# Patient Record
Sex: Female | Born: 1995 | Race: White | Hispanic: No | Marital: Single | Smoking: Never smoker
Health system: Southern US, Community
[De-identification: ages and names within clinical notes are randomized; demographics above are authoritative.]

## PROBLEM LIST (undated history)

## (undated) DIAGNOSIS — F419 Anxiety disorder, unspecified: Secondary | ICD-10-CM

## (undated) HISTORY — PX: LAPAROSCOPY: SHX197

## (undated) HISTORY — PX: KNEE ARTHROSCOPY: SHX127

## (undated) HISTORY — DX: Anxiety disorder, unspecified: F41.9

---
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
## 2017-05-31 DIAGNOSIS — H04122 Dry eye syndrome of left lacrimal gland: Secondary | ICD-10-CM | POA: Diagnosis not present

## 2017-05-31 DIAGNOSIS — H11151 Pinguecula, right eye: Secondary | ICD-10-CM | POA: Diagnosis not present

## 2017-11-05 ENCOUNTER — Other Ambulatory Visit: Payer: Self-pay | Admitting: Nurse Practitioner

## 2017-11-05 ENCOUNTER — Other Ambulatory Visit (HOSPITAL_COMMUNITY)
Admission: RE | Admit: 2017-11-05 | Discharge: 2017-11-05 | Disposition: A | Discharge status: Home / Self Care | Payer: Commercial Managed Care - PPO | Source: Ambulatory Visit | Attending: Nurse Practitioner | Admitting: Nurse Practitioner

## 2017-11-05 DIAGNOSIS — N6320 Unspecified lump in the left breast, unspecified quadrant: Secondary | ICD-10-CM | POA: Diagnosis not present

## 2017-11-05 DIAGNOSIS — Z01419 Encounter for gynecological examination (general) (routine) without abnormal findings: Secondary | ICD-10-CM | POA: Insufficient documentation

## 2017-11-05 DIAGNOSIS — R21 Rash and other nonspecific skin eruption: Secondary | ICD-10-CM | POA: Diagnosis not present

## 2017-11-08 LAB — CYTOLOGY - PAP
CHLAMYDIA, DNA PROBE: NEGATIVE
DIAGNOSIS: NEGATIVE
NEISSERIA GONORRHEA: NEGATIVE

## 2017-11-11 DIAGNOSIS — J0100 Acute maxillary sinusitis, unspecified: Secondary | ICD-10-CM | POA: Diagnosis not present

## 2017-11-14 ENCOUNTER — Other Ambulatory Visit: Payer: Self-pay

## 2017-11-26 ENCOUNTER — Ambulatory Visit
Admission: RE | Admit: 2017-11-26 | Discharge: 2017-11-26 | Disposition: A | Discharge status: Home / Self Care | Payer: Commercial Managed Care - PPO | Source: Ambulatory Visit | Attending: Nurse Practitioner | Admitting: Nurse Practitioner

## 2017-11-26 ENCOUNTER — Other Ambulatory Visit: Payer: Self-pay

## 2017-11-26 ENCOUNTER — Other Ambulatory Visit: Payer: Self-pay | Admitting: Nurse Practitioner

## 2017-11-26 DIAGNOSIS — N6320 Unspecified lump in the left breast, unspecified quadrant: Secondary | ICD-10-CM

## 2017-11-26 DIAGNOSIS — N6323 Unspecified lump in the left breast, lower outer quadrant: Secondary | ICD-10-CM | POA: Diagnosis not present

## 2017-12-04 DIAGNOSIS — J209 Acute bronchitis, unspecified: Secondary | ICD-10-CM | POA: Diagnosis not present

## 2018-05-27 ENCOUNTER — Ambulatory Visit
Admission: RE | Admit: 2018-05-27 | Discharge: 2018-05-27 | Disposition: A | Discharge status: Home / Self Care | Payer: 59 | Source: Ambulatory Visit | Attending: Nurse Practitioner | Admitting: Nurse Practitioner

## 2018-05-27 ENCOUNTER — Other Ambulatory Visit: Payer: Self-pay | Admitting: Nurse Practitioner

## 2018-05-27 DIAGNOSIS — N6320 Unspecified lump in the left breast, unspecified quadrant: Secondary | ICD-10-CM

## 2018-11-11 ENCOUNTER — Ambulatory Visit: Payer: Self-pay | Admitting: Nurse Practitioner

## 2018-11-11 VITALS — BP 116/70 | HR 82 | Temp 99.1°F | Resp 20 | Wt 110.0 lb

## 2018-11-11 DIAGNOSIS — R6889 Other general symptoms and signs: Secondary | ICD-10-CM

## 2018-11-11 DIAGNOSIS — J029 Acute pharyngitis, unspecified: Secondary | ICD-10-CM

## 2018-11-11 LAB — POCT RAPID STREP A (OFFICE): Rapid Strep A Screen: NEGATIVE

## 2018-11-11 LAB — POCT INFLUENZA A/B
Influenza A, POC: NEGATIVE
Influenza B, POC: NEGATIVE

## 2018-11-11 NOTE — Patient Instructions (Signed)
Influenza-Like Symptoms, Adult  -Ibuprofen or Tylenol for pain, fever, or general discomfort. -Increase fluids. -Sleep elevated on at least 2 pillows at bedtime to help with cough. -Use a humidifier or vaporizer when at home and during sleep to help with cough. -Purchase OTC Delsym cough medicine.  Use as directed. -May use a teaspoon of honey or over-the-counter cough drops to help with cough. -Get plenty of rest. -Follow-up if symptoms do not improve.  Influenza, more commonly known as "the flu," is a viral infection that mainly affects the respiratory tract. The respiratory tract includes organs that help you breathe, such as the lungs, nose, and throat. The flu causes many symptoms similar to the common cold along with high fever and body aches. The flu spreads easily from person to person (is contagious). Getting a flu shot (influenza vaccination) every year is the best way to prevent the flu. What are the causes? This condition is caused by the influenza virus. You can get the virus by:  Breathing in droplets that are in the air from an infected person's cough or sneeze.  Touching something that has been exposed to the virus (has been contaminated) and then touching your mouth, nose, or eyes. What increases the risk? The following factors may make you more likely to get the flu:  Not washing or sanitizing your hands often.  Having close contact with many people during cold and flu season.  Touching your mouth, eyes, or nose without first washing or sanitizing your hands.  Not getting a yearly (annual) flu shot. You may have a higher risk for the flu, including serious problems such as a lung infection (pneumonia), if you:  Are older than 65.  Are pregnant.  Have a weakened disease-fighting system (immune system). You may have a weakened immune system if you: ? Have HIV or AIDS. ? Are undergoing chemotherapy. ? Are taking medicines that reduce (suppress) the activity of  your immune system.  Have a long-term (chronic) illness, such as heart disease, kidney disease, diabetes, or lung disease.  Have a liver disorder.  Are severely overweight (morbidly obese).  Have anemia. This is a condition that affects your red blood cells.  Have asthma. What are the signs or symptoms? Symptoms of this condition usually begin suddenly and last 4-14 days. They may include:  Fever and chills.  Headaches, body aches, or muscle aches.  Sore throat.  Cough.  Runny or stuffy (congested) nose.  Chest discomfort.  Poor appetite.  Weakness or fatigue.  Dizziness.  Nausea or vomiting. How is this diagnosed? This condition may be diagnosed based on:  Your symptoms and medical history.  A physical exam.  Swabbing your nose or throat and testing the fluid for the influenza virus. How is this treated? If the flu is diagnosed early, you can be treated with medicine that can help reduce how severe the illness is and how long it lasts (antiviral medicine). This may be given by mouth (orally) or through an IV. Taking care of yourself at home can help relieve symptoms. Your health care provider may recommend:  Taking over-the-counter medicines.  Drinking plenty of fluids. In many cases, the flu goes away on its own. If you have severe symptoms or complications, you may be treated in a hospital. Follow these instructions at home: Activity  Rest as needed and get plenty of sleep.  Stay home from work or school as told by your health care provider. Unless you are visiting your health care provider,  avoid leaving home until your fever has been gone for 24 hours without taking medicine. Eating and drinking  Take an oral rehydration solution (ORS). This is a drink that is sold at pharmacies and retail stores.  Drink enough fluid to keep your urine pale yellow.  Drink clear fluids in small amounts as you are able. Clear fluids include water, ice chips, diluted  fruit juice, and low-calorie sports drinks.  Eat bland, easy-to-digest foods in small amounts as you are able. These foods include bananas, applesauce, rice, lean meats, toast, and crackers.  Avoid drinking fluids that contain a lot of sugar or caffeine, such as energy drinks, regular sports drinks, and soda.  Avoid alcohol.  Avoid spicy or fatty foods. General instructions      Take over-the-counter and prescription medicines only as told by your health care provider.  Use a cool mist humidifier to add humidity to the air in your home. This can make it easier to breathe.  Cover your mouth and nose when you cough or sneeze.  Wash your hands with soap and water often, especially after you cough or sneeze. If soap and water are not available, use alcohol-based hand sanitizer.  Keep all follow-up visits as told by your health care provider. This is important. How is this prevented?   Get an annual flu shot. You may get the flu shot in late summer, fall, or winter. Ask your health care provider when you should get your flu shot.  Avoid contact with people who are sick during cold and flu season. This is generally fall and winter. Contact a health care provider if:  You develop new symptoms.  You have: ? Chest pain. ? Diarrhea. ? A fever.  Your cough gets worse.  You produce more mucus.  You feel nauseous or you vomit. Get help right away if:  You develop shortness of breath or difficulty breathing.  Your skin or nails turn a bluish color.  You have severe pain or stiffness in your neck.  You develop a sudden headache or sudden pain in your face or ear.  You cannot eat or drink without vomiting. Summary  Influenza, more commonly known as "the flu," is a viral infection that primarily affects your respiratory tract.  Symptoms of the flu usually begin suddenly and last 4-14 days.  Getting an annual flu shot is the best way to prevent getting the flu.  Stay home  from work or school as told by your health care provider. Unless you are visiting your health care provider, avoid leaving home until your fever has been gone for 24 hours without taking medicine.  Keep all follow-up visits as told by your health care provider. This is important. This information is not intended to replace advice given to you by your health care provider. Make sure you discuss any questions you have with your health care provider. Document Released: 11/02/2000 Document Revised: 04/23/2018 Document Reviewed: 04/23/2018 Elsevier Interactive Patient Education  2019 [employer]

## 2018-11-11 NOTE — Progress Notes (Signed)
Subjective:  Jasmine Greene is a 22 y.o. female who presents for evaluation of influenza like symptoms.  Symptoms include cough described as nonproductive, headache described as dull and sore throat.  Onset of symptoms was 1 day ago, and has been unchanged since that time.  Treatment to date:  acetaminophen.  High risk factors for influenza complications:  none.  Patient states several of her family members have been diagnosed with influenza and she wanted to make sure she did not have it.  The following portions of the patient's history were reviewed and updated as appropriate:  allergies, current medications and past medical history.  Constitutional: positive for fatigue, negative for anorexia, chills, fevers and malaise Eyes: negative Ears, nose, mouth, throat, and face: negative Respiratory: positive for cough, negative for asthma, chronic bronchitis, pneumonia, stridor and wheezing Cardiovascular: negative Gastrointestinal: negative Neurological: positive for headaches, negative for coordination problems, dizziness, paresthesia, vertigo and weakness Objective:  BP 116/70 (BP Location: Right Arm, Patient Position: Sitting, Cuff Size: Normal)   Pulse 82   Temp 99.1 F (37.3 C) (Oral)   Resp 20   Wt 110 lb (49.9 kg)   SpO2 98%  General appearance: alert, cooperative, fatigued and no distress Head: Normocephalic, without obvious abnormality, atraumatic Eyes: conjunctivae/corneas clear. PERRL, EOM's intact. Fundi benign. Ears: normal TM's and external ear canals both ears Nose: no discharge, no congestion, sinus tenderness bilateral Throat: abnormal findings: mild oropharyngeal erythema Lungs: clear to auscultation bilaterally Heart: regular rate and rhythm, S1, S2 normal, no murmur, click, rub or gallop Abdomen: soft, non-tender; bowel sounds normal; no masses,  no organomegaly Pulses: 2+ and symmetric Skin: Skin color, texture, turgor normal. No rashes or lesions Lymph nodes:  Cervical adenopathy: to right neck, patient informs she is having US performed in Jan. Neurologic: Grossly normal    Assessment:  influenza like syndrome    Plan:   Exam findings, diagnosis etiology and medication use and indications reviewed with patient. Follow- Up and discharge instructions provided. No emergent/urgent issues found on exam.  Patient appears in no acute distress, vital signs are stable, and is well-appearing.  Gust with patient about Tamiflu.  Patient declined Tamiflu at this time.  Patient felt she would be able to perform symptomatic treatment that would cover her symptoms.  Patient education was provided. Patient verbalized understanding of information provided and agrees with plan of care (POC), all questions answered. The patient is advised to call or return to clinic if condition does not see an improvement in symptoms, or to seek the care of the closest emergency department if condition worsens with the above plan.   1. Sore throat  - POCT Influenza A/B-negative - POCT rapid strep A-negative  2. Influenza-like symptoms  -Ibuprofen or Tylenol for pain, fever, or general discomfort. -Increase fluids. -Sleep elevated on at least 2 pillows at bedtime to help with cough. -Use a humidifier or vaporizer when at home and during sleep to help with cough. -Purchase OTC Delsym cough medicine.  Use as directed. -May use a teaspoon of honey or over-the-counter cough drops to help with cough. -Get plenty of rest. -Follow-up if symptoms do not improve.

## 2018-11-13 ENCOUNTER — Other Ambulatory Visit: Payer: Self-pay | Admitting: Nurse Practitioner

## 2018-11-13 DIAGNOSIS — R591 Generalized enlarged lymph nodes: Secondary | ICD-10-CM

## 2018-11-17 ENCOUNTER — Ambulatory Visit
Admission: RE | Admit: 2018-11-17 | Discharge: 2018-11-17 | Disposition: A | Discharge status: Home / Self Care | Payer: 59 | Source: Ambulatory Visit | Attending: Nurse Practitioner | Admitting: Nurse Practitioner

## 2018-11-17 ENCOUNTER — Other Ambulatory Visit: Payer: 59

## 2018-11-17 DIAGNOSIS — R591 Generalized enlarged lymph nodes: Secondary | ICD-10-CM

## 2018-11-28 ENCOUNTER — Other Ambulatory Visit: Payer: Self-pay

## 2018-11-28 ENCOUNTER — Ambulatory Visit
Admission: RE | Admit: 2018-11-28 | Discharge: 2018-11-28 | Disposition: A | Discharge status: Home / Self Care | Payer: 59 | Source: Ambulatory Visit | Attending: Nurse Practitioner | Admitting: Nurse Practitioner

## 2018-11-28 DIAGNOSIS — N6320 Unspecified lump in the left breast, unspecified quadrant: Secondary | ICD-10-CM

## 2019-06-18 ENCOUNTER — Other Ambulatory Visit: Payer: Self-pay | Admitting: Gastroenterology

## 2019-06-18 DIAGNOSIS — R109 Unspecified abdominal pain: Secondary | ICD-10-CM

## 2019-06-23 ENCOUNTER — Other Ambulatory Visit: Payer: Self-pay | Admitting: Gastroenterology

## 2019-06-23 DIAGNOSIS — R109 Unspecified abdominal pain: Secondary | ICD-10-CM

## 2019-06-24 ENCOUNTER — Ambulatory Visit: Payer: 59 | Admitting: Cardiology

## 2019-06-30 ENCOUNTER — Other Ambulatory Visit: Payer: 59

## 2019-07-02 ENCOUNTER — Other Ambulatory Visit: Payer: Self-pay

## 2019-07-02 ENCOUNTER — Encounter: Payer: Self-pay | Admitting: Adult Health

## 2019-07-02 ENCOUNTER — Ambulatory Visit (INDEPENDENT_AMBULATORY_CARE_PROVIDER_SITE_OTHER): Payer: 59 | Admitting: Adult Health

## 2019-07-02 VITALS — BP 121/80 | HR 89 | Ht 61.0 in | Wt 99.0 lb

## 2019-07-02 DIAGNOSIS — F411 Generalized anxiety disorder: Secondary | ICD-10-CM

## 2019-07-02 DIAGNOSIS — F410 Panic disorder [episodic paroxysmal anxiety] without agoraphobia: Secondary | ICD-10-CM | POA: Diagnosis not present

## 2019-07-02 MED ORDER — LORAZEPAM 0.5 MG PO TABS: ORAL_TABLET | ORAL | 0 refills | Status: DC

## 2019-07-02 MED ORDER — CITALOPRAM HYDROBROMIDE 10 MG PO TABS: 10.0000 mg | ORAL_TABLET | Freq: Every day | ORAL | 2 refills | Status: DC

## 2019-07-02 NOTE — Progress Notes (Signed)
[facility] MD/PA/NP Initial Note  07/02/2019 4:27 PM Jasmine Greene  MRN:  [id number]  Chief Complaint:  Chief Complaint    Anxiety      HPI: Patient seen today for anxiety disorder.   Describes mood today as "so-so". Mood symptoms - denies depression. Increased irritability. More anxious overall. Having panic attacks - recently started over past 3 to 4 weeks. Had to pull over on side of road while driving and called father to come and get her - felt like she couldn't breathe and called 911. Gets stressed and overwhelmed about going to doctor's offices. More anxious in the evenings between 5 to 8 pm. Staying in bed on days she wakes up with "a lot of anxiety". Anxiety runs in family. More stress at home right now - increased more so recently. Symptoms increase when losing control or not knowing how things are going to go. History of ADHD high school. Did not tolerate stimulants. Continues to have issues with focus and concentration. Energy levels low. Hasn't been eating at all - feels nauseas. Feels more energetic some days - "not in a while". Decreased interest and motivation.  Enjoys some usual interests and activities. Spending time with family and dog. Dog lives with her at school - "bear" white lab.  Appetite adequate. Weight loss - was 120 and then she started having GI issues. Has changed diet.  Sleeping better some night than others. Tries to be in bed by 10 and up by 7.  Focus and concentration difficulties. Completing tasks. Manages some aspects of household. Currently lives off campus in a one bedroom apartment at [employer].  Denies SI or HI. Denies AH or VH.  Mother suggested Celexa since she is able to tolerate it.  Visit Diagnosis:    ICD-10-CM   1. Generalized anxiety disorder  F41.1 citalopram (CELEXA) 10 MG tablet    LORazepam (ATIVAN) 0.5 MG tablet    DISCONTINUED: LORazepam (ATIVAN) 0.5 MG tablet  2. Panic attacks  F41.0 citalopram (CELEXA) 10 MG tablet    LORazepam (ATIVAN) 0.5 MG tablet    DISCONTINUED: LORazepam (ATIVAN) 0.5 MG tablet    Past Psychiatric History: History of ADHD - Adderall, other medications - "dizzy and light headed".  Previous medications: Zoloft, Lexapro  Past Medical History:  Past Medical History:  Diagnosis Date  . Anxiety    History reviewed. No pertinent surgical history.  Family Psychiatric History: Has 3 brothers - 1 with anxiety and depression, one with anxiety. Mother also with anxiety.  Family History:  Family History  Problem Relation Age of Onset  . Breast cancer Maternal Grandmother     Social History:  Social History   Socioeconomic History  . Marital status: Single    Spouse name: Not on file  . Number of children: Not on file  . Years of education: Not on file  . Highest education level: Not on file  Occupational History  . Not on file  Social Needs  . Financial resource strain: Not on file  . Food insecurity    Worry: Not on file    Inability: Not on file  . Transportation needs    Medical: Not on file    Non-medical: Not on file  Tobacco Use  . Smoking status: Never Smoker  . Smokeless tobacco: Never Used  Substance and Sexual Activity  . Alcohol use: Yes    Comment: occasionally  . Drug use: Not Currently    Types: Marijuana    Comment: freshman year  of high school  . Sexual activity: Never  Lifestyle  . Physical activity    Days per week: Not on file    Minutes per session: Not on file  . Stress: Not on file  Relationships  . Social Herbalist on phone: Not on file    Gets together: Not on file    Attends religious service: Not on file    Active member of club or organization: Not on file    Attends meetings of clubs or organizations: Not on file    Relationship status: Not on file  Other Topics Concern  . Not on file  Social History Narrative  . Not on file    Allergies:  Allergies  Allergen Reactions  . Penicillins     Metabolic Disorder  Labs: No results found for: HGBA1C, MPG No results found for: PROLACTIN No results found for: CHOL, TRIG, HDL, CHOLHDL, VLDL, LDLCALC No results found for: TSH  Therapeutic Level Labs: No results found for: LITHIUM No results found for: VALPROATE No components found for:  CBMZ  Current Medications: Current Outpatient Medications  Medication Sig Dispense Refill  . citalopram (CELEXA) 10 MG tablet Take 1 tablet (10 mg total) by mouth daily. 30 tablet 2  . drospirenone-ethinyl estradiol (YAZ,GIANVI,LORYNA) 3-0.02 MG tablet Take 1 tablet by mouth daily.    Marland Kitchen LORazepam (ATIVAN) 0.5 MG tablet Take one tablet daily prn for anxiety/panic. 30 tablet 0  . METRONIDAZOLE IN NACL IV Inject into the vein.     No current facility-administered medications for this visit.     Medication Side Effects: none  Orders placed this visit:  No orders of the defined types were placed in this encounter.   Psychiatric Specialty Exam:  ROS  Blood pressure 121/80, pulse 89, height 5\' 1"  (1.549 m), weight 99 lb (44.9 kg).Body mass index is 18.71 kg/m.  General Appearance: Neat and Well Groomed  Eye Contact:  Good  Speech:  Normal Rate  Volume:  Normal  Mood:  Anxious  Affect:  Appropriate and Flat  Thought Process:  Coherent  Orientation:  Full (Time, Place, and Person)  Thought Content: Logical   Suicidal Thoughts:  No  Homicidal Thoughts:  No  Memory:  WNL  Judgement:  Good  Insight:  Good  Psychomotor Activity:  NA and Normal  Concentration:  Concentration: Good  Recall:  Good  Fund of Knowledge: Good  Language: Good  Assets:  Communication Skills Desire for Improvement Financial Resources/Insurance Housing Intimacy Leisure Time Physical Health Resilience Social Support Talents/Skills Transportation  ADL's:  Intact  Cognition: WNL  Prognosis:  Good   Screenings:   Receiving Psychotherapy: No   Treatment Plan/Recommendations:   Plan:  1. Add Celexa 10mg  tablet daily -  2.5, 5mg , 7.5mg  titration.  2. Add Ativan 0.5mg  prn anxiety - may increase to two times a day. Will call for new script if needed.   RTC 4 weeks  Discussed potential benefits, risk, and side effects of benzodiazepines to include potential risk of tolerance and dependence, as well as possible drowsiness.  Advised patient not to drive if experiencing drowsiness and to take lowest possible effective dose to minimize risk of dependence and tolerance.  Patient advised to contact office with any questions, adverse effects, or acute worsening in signs and symptoms.   Aloha Gell, NP

## 2019-07-30 ENCOUNTER — Ambulatory Visit (INDEPENDENT_AMBULATORY_CARE_PROVIDER_SITE_OTHER): Payer: 59 | Admitting: Adult Health

## 2019-07-30 ENCOUNTER — Encounter: Payer: Self-pay | Admitting: Adult Health

## 2019-07-30 ENCOUNTER — Other Ambulatory Visit: Payer: Self-pay

## 2019-07-30 DIAGNOSIS — F410 Panic disorder [episodic paroxysmal anxiety] without agoraphobia: Secondary | ICD-10-CM | POA: Insufficient documentation

## 2019-07-30 DIAGNOSIS — F909 Attention-deficit hyperactivity disorder, unspecified type: Secondary | ICD-10-CM

## 2019-07-30 DIAGNOSIS — G4700 Insomnia, unspecified: Secondary | ICD-10-CM

## 2019-07-30 DIAGNOSIS — F411 Generalized anxiety disorder: Secondary | ICD-10-CM | POA: Diagnosis not present

## 2019-07-30 MED ORDER — CITALOPRAM HYDROBROMIDE 10 MG PO TABS: 10.0000 mg | ORAL_TABLET | Freq: Every day | ORAL | 2 refills | Status: DC

## 2019-07-30 MED ORDER — LORAZEPAM 0.5 MG PO TABS: ORAL_TABLET | ORAL | 1 refills | Status: DC

## 2019-07-30 NOTE — Progress Notes (Signed)
Jasmine Greene [id number] 15-Dec-1995 22 y.o.  Virtual Visit via Telephone Note  I connected with pt on 07/30/19 at  8:30 AM EDT by telephone and verified that I am speaking with the correct person using two identifiers.   I discussed the limitations, risks, security and privacy concerns of performing an evaluation and management service by telephone and the availability of in person appointments. I also discussed with the patient that there may be a patient responsible charge related to this service. The patient expressed understanding and agreed to proceed.   I discussed the assessment and treatment plan with the patient. The patient was provided an opportunity to ask questions and all were answered. The patient agreed with the plan and demonstrated an understanding of the instructions.   The patient was advised to call back or seek an in-person evaluation if the symptoms worsen or if the condition fails to improve as anticipated.  I provided 30 minutes of non-face-to-face time during this encounter.  The patient was located at home.  The provider was located at [facility].   Dorothyann Gibbs, NP   Subjective:   Patient ID:  Jasmine Greene is a 23 y.o. (DOB [birth year]) female.  Chief Complaint:  Chief Complaint  Patient presents with  . Anxiety  . Insomnia  . Panic Attack  . Sleeping Problem    HPI Jasmine Greene presents for follow-up of   Describes mood today as "so-so". Mood symptoms - denies depression. Increased anxiety/panic attacks. Stating "I feel anxious every day". Ativan helps to "calm me down". Decreased irritability "not as much". Denies depression. Could not take Celexa - interacting with GI medications. Has to wait two weeks before restartng.  Is getting out of the house some. Has anxiety driving to school. Currently unable to drive anywhere. Parents having to drive her places and take her back and forth to school. Having increased GI  issues, but working with PCP. Decreased interest and motivation.  Energy levels low. Active, gets tired after doing anything after about an hour.  Enjoys some usual interests and activities. Living at home. Spending time with family and dog "Bear". Appetite adequate. Weight loss - was 120 before GI symptoms  - now at 91. Continues to have GI issues. PCP feels like she has a lot of "gut bacteria".  Sleeping better at night. Averages 6 to 7 hours. Denies daytime napping.  Focus and concentration difficulties. Completing tasks. Manages some aspects of household. 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 Has two in person classes that she is unable to attend. Denies SI or HI. Denies AH or VH.  Review of Systems:  Review of Systems  Gastrointestinal: Positive for abdominal pain.  Musculoskeletal: Negative for gait problem.  Neurological: Negative for tremors.  Psychiatric/Behavioral: The patient is nervous/anxious.        Please refer to HPI    Medications: I have reviewed the patient's current medications.  Current Outpatient Medications  Medication Sig Dispense Refill  . citalopram (CELEXA) 10 MG tablet Take 1 tablet (10 mg total) by mouth daily. 30 tablet 2  . drospirenone-ethinyl estradiol (YAZ,GIANVI,LORYNA) 3-0.02 MG tablet Take 1 tablet by mouth daily.    Marland Kitchen LORazepam (ATIVAN) 0.5 MG tablet Take one tablet twice daily as needed for anxiety/panic. 60 tablet 1  . METRONIDAZOLE IN NACL IV Inject into the vein.     No current facility-administered medications for this visit.     Medication Side Effects: None  Allergies:  Allergies  Allergen Reactions  . Penicillins     Past Medical History:  Diagnosis Date  . Anxiety     Family History  Problem Relation Age of Onset  . Breast cancer Maternal Grandmother     Social History   Socioeconomic History  . Marital status: Single    Spouse name: Not on file  . Number of children: Not on file  . Years of  education: Not on file  . Highest education level: Not on file  Occupational History  . Not on file  Social Needs  . Financial resource strain: Not on file  . Food insecurity    Worry: Not on file    Inability: Not on file  . Transportation needs    Medical: Not on file    Non-medical: Not on file  Tobacco Use  . Smoking status: Never Smoker  . Smokeless tobacco: Never Used  Substance and Sexual Activity  . Alcohol use: Yes    Comment: occasionally  . Drug use: Not Currently    Types: Marijuana    Comment: freshman year of high school  . Sexual activity: Never  Lifestyle  . Physical activity    Days per week: Not on file    Minutes per session: Not on file  . Stress: Not on file  Relationships  . Social Musicianconnections    Talks on phone: Not on file    Gets together: Not on file    Attends religious service: Not on file    Active member of club or organization: Not on file    Attends meetings of clubs or organizations: Not on file    Relationship status: Not on file  . Intimate partner violence    Fear of current or ex partner: Not on file    Emotionally abused: Not on file    Physically abused: Not on file    Forced sexual activity: Not on file  Other Topics Concern  . Not on file  Social History Narrative  . Not on file    Past Medical History, Surgical history, Social history, and Family history were reviewed and updated as appropriate.   Please see review of systems for further details on the patient's review from today.   Objective:   Physical Exam:  There were no vitals taken for this visit.  Physical Exam Neurological:     Mental Status: She is alert and oriented to person, place, and time.  Psychiatric:        Attention and Perception: Attention normal.        Mood and Affect: Mood is anxious.        Speech: Speech normal.        Behavior: Behavior normal.        Thought Content: Thought content normal.        Cognition and Memory: Cognition normal.      Lab Review:  No results found for: NA, K, CL, CO2, GLUCOSE, BUN, CREATININE, CALCIUM, PROT, ALBUMIN, AST, ALT, ALKPHOS, BILITOT, GFRNONAA, GFRAA  No results found for: WBC, RBC, HGB, HCT, PLT, MCV, MCH, MCHC, RDW, LYMPHSABS, MONOABS, EOSABS, BASOSABS  No results found for: POCLITH, LITHIUM   No results found for: PHENYTOIN, PHENOBARB, VALPROATE, CBMZ   .res Assessment: Plan:    Plan:  1. Celexa 10mg  tablet daily - 2.5, 5mg , 7.5mg  titration. Plans to start in 2 weeks 2. Ativan 0.5mg  BID prn anxiety/panic attacks  Will complete letters for: Emotional support animal  Excuse from attending  in person classes  RTC 4 weeks  Discussed potential benefits, risk, and side effects of benzodiazepines to include potential risk of tolerance and dependence, as well as possible drowsiness.  Advised patient not to drive if experiencing drowsiness and to take lowest possible effective dose to minimize risk of dependence and tolerance.  Patient advised to contact office with any questions, adverse effects, or acute worsening in signs and symptoms.   Jasmine Greene was seen today for anxiety, insomnia, panic attack and sleeping problem.  Diagnoses and all orders for this visit:  Generalized anxiety disorder -     LORazepam (ATIVAN) 0.5 MG tablet; Take one tablet twice daily as needed for anxiety/panic. -     citalopram (CELEXA) 10 MG tablet; Take 1 tablet (10 mg total) by mouth daily.  Panic attacks -     LORazepam (ATIVAN) 0.5 MG tablet; Take one tablet twice daily as needed for anxiety/panic. -     citalopram (CELEXA) 10 MG tablet; Take 1 tablet (10 mg total) by mouth daily.  Insomnia, unspecified type  GAD (generalized anxiety disorder)  Attention deficit hyperactivity disorder (ADHD), unspecified ADHD type    Please see After Visit Summary for patient specific instructions.  No future appointments.  No orders of the defined types were placed in this encounter.      -------------------------------

## 2019-08-04 ENCOUNTER — Telehealth: Payer: Self-pay | Admitting: Adult Health

## 2019-08-04 NOTE — Telephone Encounter (Signed)
Pt is reminding of her need for a letter for school asap. School [facility] ) is waiting on it.  Needs recommendation for therapist. Also, wants to discuss starting back on medication.

## 2019-08-05 ENCOUNTER — Telehealth: Payer: Self-pay | Admitting: Adult Health

## 2019-08-05 NOTE — Telephone Encounter (Signed)
Patient aware and verbalized understanding. Advised to call back with any problems

## 2019-08-05 NOTE — Telephone Encounter (Signed)
Noted  

## 2019-08-05 NOTE — Telephone Encounter (Signed)
Is it okay for her to take Mirtazapine 15mg  prescribed by her GI doctor for an appetite enhanser?  Ok with her other meds?

## 2019-08-05 NOTE — Telephone Encounter (Signed)
Pt. Did answer and was unable to leave a VM due to being full. I will try again later this afternoon.

## 2019-08-07 ENCOUNTER — Other Ambulatory Visit: Payer: Self-pay

## 2019-08-07 NOTE — Telephone Encounter (Signed)
Spoke with pt. And she is going to call back on Monday. She did not have the information with her she needed to ask her question.

## 2019-09-02 NOTE — Telephone Encounter (Signed)
Pt. Has not called back. I am closing out this message. If she calls back with her information I will let you know.

## 2019-09-03 ENCOUNTER — Ambulatory Visit: Payer: 59 | Admitting: Cardiology

## 2019-09-16 ENCOUNTER — Telehealth: Payer: Self-pay | Admitting: Adult Health

## 2019-09-16 DIAGNOSIS — F411 Generalized anxiety disorder: Secondary | ICD-10-CM

## 2019-09-16 DIAGNOSIS — F410 Panic disorder [episodic paroxysmal anxiety] without agoraphobia: Secondary | ICD-10-CM

## 2019-09-16 NOTE — Telephone Encounter (Signed)
Pt called to ask if ok to start Celexa again. Had taken for 5 days and was advise by another doctor to stop due to gastro meds. Stopped all gastro meds. Has remaining Rx from before. Need advise how to start if ok.  [phone]

## 2019-09-17 ENCOUNTER — Other Ambulatory Visit: Payer: Self-pay | Admitting: Adult Health

## 2019-09-17 DIAGNOSIS — F411 Generalized anxiety disorder: Secondary | ICD-10-CM

## 2019-09-17 DIAGNOSIS — F410 Panic disorder [episodic paroxysmal anxiety] without agoraphobia: Secondary | ICD-10-CM

## 2019-09-17 MED ORDER — LORAZEPAM 0.5 MG PO TABS: ORAL_TABLET | ORAL | 1 refills | Status: DC

## 2019-09-17 NOTE — Telephone Encounter (Signed)
Why is she starting back?

## 2019-09-18 NOTE — Telephone Encounter (Signed)
Appt has been made.

## 2019-10-23 ENCOUNTER — Other Ambulatory Visit: Payer: Self-pay

## 2019-10-23 ENCOUNTER — Ambulatory Visit (INDEPENDENT_AMBULATORY_CARE_PROVIDER_SITE_OTHER): Payer: 59 | Admitting: Psychiatry

## 2019-10-23 DIAGNOSIS — F411 Generalized anxiety disorder: Secondary | ICD-10-CM | POA: Diagnosis not present
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
## 2019-11-06 ENCOUNTER — Ambulatory Visit (INDEPENDENT_AMBULATORY_CARE_PROVIDER_SITE_OTHER): Payer: 59 | Admitting: Psychiatry

## 2019-11-06 ENCOUNTER — Other Ambulatory Visit: Payer: Self-pay

## 2019-11-06 DIAGNOSIS — F411 Generalized anxiety disorder: Secondary | ICD-10-CM

## 2019-11-06 NOTE — Progress Notes (Signed)
[facility] Counselor/Therapist Progress Note  Patient ID: Jasmine Greene, MRN: [id number],    Date: 11/06/2019  Time Spent: 60 minutes  1:00pm to 2:00pm  Treatment Type: Individual Therapy  Reported Symptoms: anxiety, occasional panic  Mental Status Exam:  Appearance:   Casual     Behavior:  Appropriate and Sharing  Motor:  Normal  Speech/Language:   Normal Rate  Affect:  anxious  Mood:  anxious  Thought process:  goal directed  Thought content:    WNL  Sensory/Perceptual disturbances:    WNL  Orientation:  oriented to person, place, time/date, situation, day of week, month of year and year  Attention:  Good  Concentration:  Good  Memory:  WNL  Fund of knowledge:   Good  Insight:    Good  Judgment:   Good  Impulse Control:  Good   Risk Assessment: Danger to Self:  No Self-injurious Behavior: No Danger to Others: No Duty to Warn:no Physical Aggression / Violence:No  Access to Firearms a concern: No  Gang Involvement:No   Subjective: Patient in today and reports some decrease in her anxiety, but "not a lot".  Didn't follow up in written part of her homework but was able to share info in session today.  Several times said "I can't change" so we discussed that some and she seemed to eventually understand how that statement and mindset will hold her back from progressing if she does not work to change it.  She seemed to understand some and at least was thinking about it.  .     Interventions: Cognitive Behavioral Therapy and Solution-Oriented/Positive Psychology  Diagnosis:   ICD-10-CM   1. Generalized anxiety disorder  F41.1      Plan of Care:  Patient not signing tx plan on computer screen due to [place].  Treatment Goals: Goals will remain on tx plan as patiet works with strategies to meet her goal.  Progress will be noted each visit on "Progress" section of Plan.  Long term goal: Reduce overall level, frequency, and intensity of the anxiety so  that daily functioning is not impaired.  Short term goal: Increase understanding of beliefs and messages that produce worry and anxiety.  Strategy: Identify, challenge, and replace fearful/negative/anxious self-talk with positive, reality-based, empowering self-talk.  Progress: Patient in today and reporting on her observations re: thoughts between sessions. Did not do the documentation as planned but was able to provide the info needed.  States she didn't have a lot of anxious thoughts but did experience some daily. "No real extremes."  "The times I was anxious, I used distraction to re-focus on other things which helps.  States that she knows she needs to work on her thoughts, certain self-talk, and saying "I can't."  Used examples today of thoughts that work against her and how they can be changed to be more supportive, reality based, and empowering .  Practiced with these examples and patient is to work with this between sessions as well.  Also focused on stop saying "I Can't" and replace with "I Am" statements. Motivation will hopefully increase and that was part of our conversation today also.  "Patient admits her mom said she needed to be in therapy," But patient says she does realize "I need to work on my anxiety, just hard to change."  Encouraged her to work on changes we spoke about today and she agreed.  Goal review and some progress noted with patient.  Next appt within 2-3 weeks.  Shanon Ace, LCSW

## 2019-11-16 ENCOUNTER — Other Ambulatory Visit: Payer: Self-pay | Admitting: Nurse Practitioner

## 2019-11-16 DIAGNOSIS — N6002 Solitary cyst of left breast: Secondary | ICD-10-CM

## 2019-11-24 ENCOUNTER — Ambulatory Visit: Payer: 59 | Attending: Internal Medicine

## 2019-11-24 ENCOUNTER — Other Ambulatory Visit: Payer: 59

## 2019-11-24 DIAGNOSIS — Z20822 Contact with and (suspected) exposure to covid-19: Secondary | ICD-10-CM

## 2019-11-25 ENCOUNTER — Ambulatory Visit (INDEPENDENT_AMBULATORY_CARE_PROVIDER_SITE_OTHER): Payer: 59 | Admitting: Psychiatry

## 2019-11-25 ENCOUNTER — Other Ambulatory Visit: Payer: Self-pay

## 2019-11-25 DIAGNOSIS — F411 Generalized anxiety disorder: Secondary | ICD-10-CM
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
## 2019-11-27 ENCOUNTER — Ambulatory Visit: Payer: 59 | Admitting: Psychiatry

## 2019-11-27 LAB — NOVEL CORONAVIRUS, NAA: SARS-CoV-2, NAA: NOT DETECTED

## 2019-12-09 ENCOUNTER — Ambulatory Visit
Admission: RE | Admit: 2019-12-09 | Discharge: 2019-12-09 | Disposition: A | Discharge status: Home / Self Care | Payer: 59 | Source: Ambulatory Visit | Attending: Nurse Practitioner | Admitting: Nurse Practitioner

## 2019-12-09 ENCOUNTER — Other Ambulatory Visit: Payer: Self-pay

## 2019-12-09 DIAGNOSIS — N6002 Solitary cyst of left breast: Secondary | ICD-10-CM

## 2019-12-11 ENCOUNTER — Other Ambulatory Visit: Payer: Self-pay

## 2019-12-11 ENCOUNTER — Telehealth: Payer: Self-pay | Admitting: Adult Health

## 2019-12-11 ENCOUNTER — Ambulatory Visit (INDEPENDENT_AMBULATORY_CARE_PROVIDER_SITE_OTHER): Payer: 59 | Admitting: Psychiatry

## 2019-12-11 DIAGNOSIS — F411 Generalized anxiety disorder: Secondary | ICD-10-CM

## 2019-12-11 DIAGNOSIS — F410 Panic disorder [episodic paroxysmal anxiety] without agoraphobia: Secondary | ICD-10-CM

## 2019-12-11 MED ORDER — LORAZEPAM 0.5 MG PO TABS: ORAL_TABLET | ORAL | 0 refills | Status: DC

## 2019-12-11 NOTE — Progress Notes (Signed)
      [facility] Counselor/Therapist Progress Note  Patient ID: Jasmine Greene, MRN: [id number],    Date: 12/11/2019  Time Spent: 60 minutes  10:00am to 11:00am  Treatment Type: Individual Therapy  Reported Symptoms: anxiety (improved); guilt re: parents and potential move  Mental Status Exam:  Appearance:   Casual     Behavior:  Appropriate and Sharing  Motor:  Normal  Speech/Language:   Normal Rate  Affect:  anxiety (but less this past week)  Mood:  some anxiety although decreased some recently; guilt re: issues with parents  Thought process:  goal directed  Thought content:    WNL  Sensory/Perceptual disturbances:    WNL  Orientation:  oriented to person, place, time/date, situation, day of week, month of year and year  Attention:  Good  Concentration:  Good  Memory:  WNL  Fund of knowledge:   Good  Insight:    Good  Judgment:   Good  Impulse Control:  Good   Risk Assessment: Danger to Self:  No Self-injurious Behavior: No Danger to Others: No Duty to Warn:no Physical Aggression / Violence:No  Access to Firearms a concern: No  Gang Involvement:No   Subjective:  Patient in today reporting anxiety and feeling stuck here in [place].  Anxiety has been better this week.  Parents don't want her to move away but she is wanting to move to [place] which is something she has been exploring for a while.  Interventions: Cognitive Behavioral Therapy and Solution-Oriented/Positive Psychology  Diagnosis:   ICD-10-CM   1. Generalized anxiety disorder  F41.1      Plan of Care: Patient not signing tx plan on computer screen due to COVID.  Treatment Goals: Goals will remain on tx plan as patiet works with strategies to meet her goal. Progress will be noted each visit on "Progress" section of Plan.  Long term goal: Reduce overall level, frequency, and intensity of the anxiety so that daily functioning is not impaired.  Short term goal: Increase  understanding of beliefs and messages that produce worry and anxiety.  Strategy: Identify, challenge, and replace fearful/negative/anxious self-talk with positive, reality-based, empowering self-talk.  Progress: Patient in today reporting some success in lowering the frequency and intensity of her anxiety most recently.  When she does experience anxiety, more often it is in the a.m. but can occur at night also. Had good trip recently to [place] except the last day where her brother "had an episode and walked in street" and other erratic behaviors. He did come back to where family was staying and is better but reportedly going to get treatment.  "May be bipolar."  Patient states her anxiety has been better past week.  "I feel like anxiety is still there in my head but am better at managing it. Wants to move to [place] and work there but mom does not want her to do so. Feels "like other people's anxiety rub off on her". Reinforced anxiety management skills and positive self-care. Realizing she has made some improvement in anxiety management and wants to hold onto those gains. Struggling with guilt from her parents and processed this in session today, and looked at ways of working through this better with parents. Is calmer today.  Continue to monitor anxiety. Goal review and progress noted with patient  Next appt is within 2 weeks.  Mathis Fare, LCSW

## 2019-12-11 NOTE — Telephone Encounter (Signed)
Last refill for Lorazepam is 11/08/2019 #60, will submit refill.    Will have to discuss increase on Celexa with Rene Kocher on Monday when she returns to office

## 2019-12-11 NOTE — Telephone Encounter (Signed)
Pt had appt with therapist DD today. At check out she requested refill for Lorazepam and to increase dose on Celexa. Last appt with you 9/10. Please advise if appt is needed before change.

## 2019-12-14 ENCOUNTER — Other Ambulatory Visit: Payer: Self-pay

## 2019-12-14 ENCOUNTER — Encounter: Payer: Self-pay | Admitting: Adult Health

## 2019-12-14 ENCOUNTER — Ambulatory Visit (INDEPENDENT_AMBULATORY_CARE_PROVIDER_SITE_OTHER): Payer: 59 | Admitting: Adult Health

## 2019-12-14 DIAGNOSIS — F410 Panic disorder [episodic paroxysmal anxiety] without agoraphobia: Secondary | ICD-10-CM

## 2019-12-14 DIAGNOSIS — F411 Generalized anxiety disorder: Secondary | ICD-10-CM | POA: Diagnosis not present

## 2019-12-14 DIAGNOSIS — F909 Attention-deficit hyperactivity disorder, unspecified type: Secondary | ICD-10-CM

## 2019-12-14 DIAGNOSIS — G4700 Insomnia, unspecified: Secondary | ICD-10-CM | POA: Diagnosis not present

## 2019-12-14 MED ORDER — CITALOPRAM HYDROBROMIDE 10 MG PO TABS: ORAL_TABLET | ORAL | 5 refills | Status: DC

## 2019-12-14 MED ORDER — LORAZEPAM 0.5 MG PO TABS: ORAL_TABLET | ORAL | 2 refills | Status: DC

## 2019-12-14 NOTE — Progress Notes (Signed)
Jasmine Greene [id number] 1996-09-28 24 y.o.  Subjective:   Patient ID:  Jasmine Greene is a 24 y.o. (DOB [birth year]) female.  Chief Complaint:  Chief Complaint  Patient presents with  . Anxiety  . Insomnia  . Panic Attack  . ADHD    HPI Jasmine Greene presents to the office today for follow-up of GAD, ADHD, insomnia, and panic attacks.  Describes mood today as "ok". Mood symptoms - denies depression. Continues to have anxiety and panic attacks, but is doing "better" overall. Wanting to increase Celexa from 10mg  to 20mg  daily. Decreased GI issues - "feeling better' - tolerating medications. Is getting out of the house some. Has been driving more. Driving with friends to the mountains to snowboard twice a week. Graduated from college in December. Stable interest and motivation. Taking medications as prescribed.  Energy levels good. Active, has a regular exercise routine. Walking dog daily. 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 Enjoys some usual interests and activities. Single. Moved into an apartment with her dog "Bear".  Appetite adequate. Weight gain 96 from 91. Decreased GI issues.  Sleeps better some nights than others. Waking up "worrying and thinking". Averages 6 to 7 hours. Denies daytime napping.  Focus and concentration stable. Completing tasks. Manages some aspects of household. Recently graduated from . Denies SI or HI. Denies AH or VH.    Review of Systems:  Review of Systems  Musculoskeletal: Negative for gait problem.  Neurological: Negative for tremors.  Psychiatric/Behavioral:       Please refer to HPI    Medications: I have reviewed the patient's current medications.  Current Outpatient Medications  Medication Sig Dispense Refill  . citalopram (CELEXA) 10 MG tablet Take one and 1/2 tablets daily x 7 days, then take two tablets daily. 60 tablet 5  . drospirenone-ethinyl estradiol (YAZ,GIANVI,LORYNA) 3-0.02 MG tablet  Take 1 tablet by mouth daily.    [employer] LORazepam (ATIVAN) 0.5 MG tablet Take one tablet twice daily as needed for anxiety/panic. 60 tablet 2  . METRONIDAZOLE IN NACL IV Inject into the vein.     No current facility-administered medications for this visit.    Medication Side Effects: None  Allergies:  Allergies  Allergen Reactions  . Penicillins     Past Medical History:  Diagnosis Date  . Anxiety     Family History  Problem Relation Age of Onset  . Breast cancer Maternal Grandmother     Social History   Socioeconomic History  . Marital status: Single    Spouse name: Not on file  . Number of children: Not on file  . Years of education: Not on file  . Highest education level: Not on file  Occupational History  . Not on file  Tobacco Use  . Smoking status: Never Smoker  . Smokeless tobacco: Never Used  Substance and Sexual Activity  . Alcohol use: Yes    Comment: occasionally  . Drug use: Not Currently    Types: Marijuana    Comment: freshman year of high school  . Sexual activity: Never  Other Topics Concern  . Not on file  Social History Narrative  . Not on file   Social Determinants of Health   Financial Resource Strain:   . Difficulty of Paying Living Expenses: Not on file  Food Insecurity:   . Worried About 12-06-1996 in the Last Year: Not on file  . Ran Out of Food in the Last Year: Not on file  Transportation  Needs:   . Lack of Transportation (Medical): Not on file  . Lack of Transportation (Non-Medical): Not on file  Physical Activity:   . Days of Exercise per Week: Not on file  . Minutes of Exercise per Session: Not on file  Stress:   . Feeling of Stress : Not on file  Social Connections:   . Frequency of Communication with Friends and Family: Not on file  . Frequency of Social Gatherings with Friends and Family: Not on file  . Attends Religious Services: Not on file  . Active Member of Clubs or Organizations: Not on file  . Attends  Archivist Meetings: Not on file  . Marital Status: Not on file  Intimate Partner Violence:   . Fear of Current or Ex-Partner: Not on file  . Emotionally Abused: Not on file  . Physically Abused: Not on file  . Sexually Abused: Not on file    Past Medical History, Surgical history, Social history, and Family history were reviewed and updated as appropriate.   Please see review of systems for further details on the patient's review from today.   Objective:   Physical Exam:  There were no vitals taken for this visit.  Physical Exam Constitutional:      General: She is not in acute distress.    Appearance: She is well-developed.  Musculoskeletal:        General: No deformity.  Neurological:     Mental Status: She is alert and oriented to person, place, and time.     Coordination: Coordination normal.  Psychiatric:        Attention and Perception: Attention and perception normal. She does not perceive auditory or visual hallucinations.        Mood and Affect: Mood normal. Mood is not anxious or depressed. Affect is not labile, blunt, angry or inappropriate.        Speech: Speech normal.        Behavior: Behavior normal.        Thought Content: Thought content normal. Thought content is not paranoid or delusional. Thought content does not include homicidal or suicidal ideation. Thought content does not include homicidal or suicidal plan.        Cognition and Memory: Cognition and memory normal.        Judgment: Judgment normal.     Comments: Insight intact     Lab Review:  No results found for: NA, K, CL, CO2, GLUCOSE, BUN, CREATININE, CALCIUM, PROT, ALBUMIN, AST, ALT, ALKPHOS, BILITOT, GFRNONAA, GFRAA  No results found for: WBC, RBC, HGB, HCT, PLT, MCV, MCH, MCHC, RDW, LYMPHSABS, MONOABS, EOSABS, BASOSABS  No results found for: POCLITH, LITHIUM   No results found for: PHENYTOIN, PHENOBARB, VALPROATE, CBMZ   .res Assessment: Plan:    Plan:  1. Increase  Celexa 10mg  to 20mg  tablet daily 2. Ativan 0.5mg  BID prn anxiety/panic attacks  RTC 3 months  Discussed potential benefits, risk, and side effects of benzodiazepines to include potential risk of tolerance and dependence, as well as possible drowsiness.  Advised patient not to drive if experiencing drowsiness and to take lowest possible effective dose to minimize risk of dependence and tolerance.  Patient advised to contact office with any questions, adverse effects, or acute worsening in signs and symptoms.   Jaquaya was seen today for anxiety, insomnia, panic attack and adhd.  Diagnoses and all orders for this visit:  Attention deficit hyperactivity disorder (ADHD), unspecified ADHD type  GAD (generalized anxiety disorder)  Insomnia,  unspecified type  Panic attacks -     citalopram (CELEXA) 10 MG tablet; Take one and 1/2 tablets daily x 7 days, then take two tablets daily. -     LORazepam (ATIVAN) 0.5 MG tablet; Take one tablet twice daily as needed for anxiety/panic.  Generalized anxiety disorder -     citalopram (CELEXA) 10 MG tablet; Take one and 1/2 tablets daily x 7 days, then take two tablets daily. -     LORazepam (ATIVAN) 0.5 MG tablet; Take one tablet twice daily as needed for anxiety/panic.     Please see After Visit Summary for patient specific instructions.  Future Appointments  Date Time Provider Department Center  12/25/2019  9:00 AM Mathis Fare, LCSW [facility] None  01/08/2020 10:00 AM Mathis Fare, LCSW [facility] None  01/22/2020 10:00 AM Mathis Fare, LCSW [facility] None  03/14/2020  9:00 AM Masayoshi Couzens, Thereasa Solo, NP [facility] None    No orders of the defined types were placed in this encounter.   -------------------------------

## 2019-12-14 NOTE — Telephone Encounter (Signed)
Pt called and scheduled today @ 2:40.

## 2019-12-14 NOTE — Telephone Encounter (Signed)
Noted  

## 2019-12-14 NOTE — Telephone Encounter (Signed)
She needs an appointment.

## 2019-12-25 ENCOUNTER — Telehealth: Payer: Self-pay

## 2019-12-25 ENCOUNTER — Ambulatory Visit (INDEPENDENT_AMBULATORY_CARE_PROVIDER_SITE_OTHER): Payer: 59 | Admitting: Psychiatry

## 2019-12-25 ENCOUNTER — Other Ambulatory Visit: Payer: Self-pay

## 2019-12-25 DIAGNOSIS — F411 Generalized anxiety disorder: Secondary | ICD-10-CM | POA: Diagnosis not present

## 2019-12-25 NOTE — Telephone Encounter (Signed)
After patient's visit with Rockne Menghini, she asked to speak with me about her medication. She is currently taking celexa 15 mg, will be increasing to 20 mg on Monday, feels her moods are doing well. Her issue has been sleeping, trouble falling asleep and staying asleep. Patient has never tried Melatonin so recommend she get some over the weekend, suggested 3-6 mg about 30-45 minutes prior to bed and see if it helps. Informed her if she needs her lorazepam at hs she can take it then. She agreed. Informed her I would call her back on Monday to follow up.

## 2019-12-25 NOTE — Progress Notes (Signed)
      [facility] Counselor/Therapist Progress Note  Patient ID: Jasmine Greene, MRN: [id number],    Date: 12/25/2019  Time Spent: 60 minutes 9:00am to 10:00am  Treatment Type: Individual Therapy  Reported Symptoms:  Anxiety  Mental Status Exam:  Appearance:   Casual     Behavior:  Appropriate and Sharing  Motor:  Normal  Speech/Language:   Normal  Affect:  anxious  Mood:  anxious  Thought process:  normal  Thought content:    WNL  Sensory/Perceptual disturbances:    WNL  Orientation:  oriented to person, place, time/date, situation, day of week, month of year and year  Attention:  Good  Concentration:  Good  Memory:  WNL  Fund of knowledge:   Good  Insight:    Good  Judgment:   Good  Impulse Control:  Good   Risk Assessment: Danger to Self:  No Self-injurious Behavior: No Danger to Others: No Duty to Warn:no Physical Aggression / Violence:No  Access to Firearms a concern: No  Gang Involvement:No   Subjective: Patient reports doing better more recently and not as anxious during the day.  Sleep has been "off" past 3 nights. Waking up and falling back asleep a couple hours later. Tried to nap and couldn't go to sleep.   Interventions: Cognitive Behavioral Therapy and Solution-Oriented/Positive Psychology  Diagnosis:   ICD-10-CM   1. GAD (generalized anxiety disorder)  F41.1      Plan of Care: Patient not signing tx plan on computer screen due to COVID.  Treatment Goals: Goals will remain on tx plan as patiet works with strategies to meet her goal. Progress will be noted each visit on "Progress" section of Plan.  Long term goal: Reduce overall level, frequency, and intensity of the anxiety so that daily functioning is not impaired.  Short term goal: Increase understanding of beliefs and messages that produce worry and anxiety.  Strategy: Identify, challenge, and replace fearful/negative/anxious self-talk with positive, reality-based,  empowering self-talk.  Progress: Anxiety is lessened some.  Self-talk is improving. Don't need as much re-assurance from parents. Getting better able to reassure self sometimes.  "Hardest times are when my body feels the anxiety." Discussed how she can be more self-aware and identify her triggers earlier. 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 Brother is now on meds and is doing some better. Still "feels anxiety is in my head and I'm still working to better manage it. Once it hits my body, it's harder." Has not had any panic attacks since last appt. And am better with my self-talk which helps the anxiety.  Review of anxious thoughts that lead to anxiety, anxiety management skills, and positive self-care (physical and mental).  Some guilt from parents remains especially when they have different views on situations. Working to not attach to the guilt, yet be respectful and preserve relationship with parents.  Goal review and progress noted with patient.  Next appt within 2 weeks.  Mathis Fare, LCSW

## 2019-12-28 NOTE — Telephone Encounter (Signed)
Noted  

## 2020-01-07 ENCOUNTER — Ambulatory Visit: Payer: 59 | Admitting: Psychiatry

## 2020-01-08 ENCOUNTER — Ambulatory Visit: Payer: 59 | Admitting: Psychiatry

## 2020-01-22 ENCOUNTER — Other Ambulatory Visit: Payer: Self-pay

## 2020-01-22 ENCOUNTER — Ambulatory Visit (INDEPENDENT_AMBULATORY_CARE_PROVIDER_SITE_OTHER): Payer: 59 | Admitting: Psychiatry

## 2020-01-22 DIAGNOSIS — F411 Generalized anxiety disorder: Secondary | ICD-10-CM

## 2020-01-22 NOTE — Progress Notes (Signed)
      [facility] Counselor/Therapist Progress Note  Patient ID: Jasmine Greene, MRN: [id number],    Date: 01/22/2020  Time Spent: 60 minutes  10:00am to 11:00am  Treatment Type: Individual Therapy  Reported Symptoms: anxiety  Mental Status Exam:  Appearance:   Casual     Behavior:  Appropriate and Sharing  Motor:  Normal  Speech/Language:   Normal Rate  Affect:  anxious  Mood:  anxious  Thought process:  normal  Thought content:    WNL  Sensory/Perceptual disturbances:    WNL  Orientation:  oriented to person, place, time/date, situation, day of week, month of year and year  Attention:  Good/Fair  Concentration:  Good/Fair  Memory:  WNL  Fund of knowledge:   Good  Insight:    Good  Judgment:   Good  Impulse Control:  Good   Risk Assessment: Danger to Self:  No Self-injurious Behavior: No Danger to Others: No Duty to Warn:no Physical Aggression / Violence:No  Access to Firearms a concern: No  Gang Involvement:No   Subjective: Patient today reports anxiety which has worsened past 2-3 weeks.  Went to [place] to visit sister. 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    Interventions: Cognitive Behavioral Therapy and Solution-Oriented/Positive Psychology  Diagnosis:   ICD-10-CM   1. GAD (generalized anxiety disorder)  F41.1     Plan of Care: Patient not signing tx plan on computer screen due to COVID.  Treatment Goals: Goals will remain on tx plan as patiet works with strategies to meet her goal. Progress will be noted each visit on "Progress" section of Plan.  Long term goal: Reduce overall level, frequency, and intensity of the anxiety so that daily functioning is not impaired.  Short term goal: Increase understanding of beliefs and messages that produce worry and anxiety.  Strategy: Identify, challenge, and replace fearful/negative/anxious self-talk with positive, reality-based, empowering self-talk.  Progress: Patient reports increase  in anxiety past 2-3 weeks, including "a couple anxiety attacks."  States that she can't recall any of the anxious thoughts when she is feeling anxious.  Asked her if she was feeling anxious right now and she said "yes".  Then asked about current anxious thoughts and she was able to provide a couple in the moment.  Worked with her on strategy.of identifying anxious thoughts, interrupting them, and then replacing them with more positive, reality-based, and empowering thoughts that do not support anxiety. Reviewed these steps with examples she has had recently and she is to practice this more between sessions.  Self-talk "is about the same but no worse." With anxiety increasing more recently, she has felt "more edgy" at times.  Things that typically stress her more and create anxiety are: being late, guilt, disappointing someone, feeling sick, flying in plane, larger groups in a closed room, getting shots, feeling physically stuck somewhere.  Guilt and feeling sick are the more frequent things that cause anxeity. States that "coloring adult coloring books" can help her anxious feelings.   Goal review and progress noted with patient.  Next appt within 2-3 weeks.      Mathis Fare, LCSW

## 2020-02-01 ENCOUNTER — Other Ambulatory Visit: Payer: Self-pay | Admitting: Family Medicine

## 2020-02-01 DIAGNOSIS — R41840 Attention and concentration deficit: Secondary | ICD-10-CM

## 2020-02-05 ENCOUNTER — Ambulatory Visit (INDEPENDENT_AMBULATORY_CARE_PROVIDER_SITE_OTHER): Payer: 59 | Admitting: Psychiatry

## 2020-02-05 ENCOUNTER — Other Ambulatory Visit: Payer: Self-pay

## 2020-02-05 DIAGNOSIS — F411 Generalized anxiety disorder: Secondary | ICD-10-CM

## 2020-02-05 NOTE — Progress Notes (Signed)
[facility] Counselor/Therapist Progress Note  Patient ID: Jasmine Greene, MRN: [id number],    Date: 02/05/2020  Time Spent:  60 minutes   11:00am to 12:00pm  Treatment Type: Individual Therapy  Reported Symptoms: anxiety, tired- and spoke with PCP about this and lab work done came back "normal"  Mental Status Exam:  Appearance:   Casual     Behavior:  Appropriate and Sharing  Motor:  Normal  Speech/Language:   Normal Rate  Affect:  anxious  Mood:  anxious  Thought process:  normal  Thought content:    WNL  Sensory/Perceptual disturbances:    WNL  Orientation:  oriented to person, place, time/date, situation, day of week, month of year and year  Attention:  Good  Concentration:  Good  Memory:  WNL  Fund of knowledge:   Good  Insight:    Good  Judgment:   Good  Impulse Control:  Good   Risk Assessment: Danger to Self:  No Self-injurious Behavior: No Danger to Others: No Duty to Warn:no Physical Aggression / Violence:No  Access to Firearms a concern: No  Gang Involvement:No   Subjective: Patient reports no depression now. Anxiety still present but not as bad, and having some success in better managing it.  Got more irritable past week but "don't know why".  Still having frequent tiredness and consulted with her PCP.  Lab work done but it came back "normal".  Is sleeping ok but "do have weird dreams at times but no common themes."    Interventions: Cognitive Behavioral Therapy and Solution-Oriented/Positive Psychology  Diagnosis:   ICD-10-CM   1. GAD (generalized anxiety disorder)  F41.1     Plan of Care:  Patient not signing tx plan on computer screen due to COVID.  Treatment Goals: Goals will remain on tx plan as patiet works with strategies to meet her goal. Progress will be noted each visit on "Progress" section of Plan.  Long term goal: Reduce overall level, frequency, and intensity of the anxiety so that daily functioning is not  impaired.  Short term goal: Increase understanding of beliefs and messages that produce worry and anxiety.  Strategy: Identify, challenge, and replace fearful/negative/anxious self-talk with positive, reality-based, empowering self-talk.  Progress: Patient in today reporting some improvement in her self-talk such as coaching myself through stressors with positive words and not self-negating words. Today sharing increased anxiety in relationship with parents. Communication issues between them are an issue. Patient feels they are controlling and too frequent contact----"then I end up feeling guilty" as one parent has some MH issues.  Discussed differences in her and parent's perceptions, preferences, and some communication issues with them as this leads to some volatility for patient emotionally.  Some difficulty due to the fact she is still somewhat reliant on parent's help financially. Anxious thoughts still persist but not quite as bad "when compared to recent weeks."  Felt calmer when parents were out of town.   Worked today on further improving and supporting more positive self-talk and being able to find the positives even when things are stressful.  Patient receptive and is some better at tagging her anxious thoughts, interrupting them, and coming up with more reality-based, positive, and empowering thoughts that don't support anxiety. Used an example of a recent anxious though that patient could easily relate to.  Guilt is still an issue we are working on and it seems some less today .    Goal review and progress noted with patient.  Next appt within 2 weeks.   Shanon Ace, LCSW

## 2020-02-23 ENCOUNTER — Other Ambulatory Visit: Payer: 59

## 2020-02-25 ENCOUNTER — Ambulatory Visit: Payer: 59 | Admitting: Psychiatry

## 2020-03-08 ENCOUNTER — Ambulatory Visit
Admission: RE | Admit: 2020-03-08 | Discharge: 2020-03-08 | Disposition: A | Discharge status: Home / Self Care | Payer: 59 | Source: Ambulatory Visit | Attending: Family Medicine | Admitting: Family Medicine

## 2020-03-08 DIAGNOSIS — R41840 Attention and concentration deficit: Secondary | ICD-10-CM

## 2020-03-10 ENCOUNTER — Ambulatory Visit (INDEPENDENT_AMBULATORY_CARE_PROVIDER_SITE_OTHER): Payer: 59 | Admitting: Psychiatry

## 2020-03-10 ENCOUNTER — Other Ambulatory Visit: Payer: Self-pay

## 2020-03-10 DIAGNOSIS — F411 Generalized anxiety disorder: Secondary | ICD-10-CM | POA: Diagnosis not present

## 2020-03-10 NOTE — Progress Notes (Signed)
[facility] Counselor/Therapist Progress Note  Patient ID: Jasmine Greene, MRN: [id number],    Date: 03/10/2020  Time Spent: 60 minutes   10:00am to 11:00am  Treatment Type: Individual Therapy  Reported Symptoms: anxiety (some better), tired but"I think that's allergies and it's always like this"  Mental Status Exam:  Appearance:   Casual     Behavior:  Appropriate and Sharing  Motor:  Normal  Speech/Language:   Normal Rate  Affect:  anxious  Mood:  anxious  Thought process:  normal  Thought content:    WNL  Sensory/Perceptual disturbances:    WNL  Orientation:  oriented to person, place, time/date, situation, day of week, month of year and year  Attention:  Good  Concentration:  Good  Memory:  WNL  Fund of knowledge:   Good  Insight:    Good/Fair  Judgment:   Good  Impulse Control:  Good/Fair   Risk Assessment: Danger to Self:  No Self-injurious Behavior: No Danger to Others: No Duty to Warn:no Physical Aggression / Violence:No  Access to Firearms a concern: No  Gang Involvement:No   Subjective: Patient in today reporting anxiety , tired, and still very indecisive on her life goals and [place].  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   Interventions: Cognitive Behavioral Therapy and Ego-Supportive  Diagnosis:   ICD-10-CM   1. GAD (generalized anxiety disorder)  F41.1     Plan of Care:  Patient not signing tx plan on computer screen due to COVID.  Treatment Goals: Goals will remain on tx plan as patiet works with strategies to meet her goal. Progress will be noted each visit on "Progress" section of Plan.  Long term goal: Reduce overall level, frequency, and intensity of the anxiety so that daily functioning is not impaired.  Short term goal: Increase understanding of beliefs and messages that produce worry and anxiety.  Strategy: Identify, challenge, and replace  fearful/negative/anxious self-talk with positive, reality-based, empowering self-talk.  Progress: Patient in today with anxiety and wanting to "move forward".  Feels stuck in [place] and have wanted to get job and move out to [place] area.  Has given it a lot of thought as to how she would do this and the steps she envisions for this.  Does a lot of "what ifs" in her thinking and is trying to change this to "what am I going to do today?"  Feel she is actually taking some steps toward this overall goal.  States that her "axniety gets in her way in working on goals as I look for what may go wrong."  Processed this with patient as well as her feelings about her parent incase she moves. Feels her parents need her but not for good reasons.  "I contanty remind them of thatl". "Our relationship is not healthy for me."  I worry about them how they make me feel.. Wanting to look at more steps to take WN:UUVO of moving to [place]. Shared her thoughts on this and her anxious/sometime avoidant thoughts were obvious, so we worked on recognizing those specific thoughts, testing their validity, and then changing them to be more positive, reality-based, and empowering.  Encouraged patient to practice this thought recognition and changing strategy more on her own and she is to do this between sessions.  Less guilt reported by patient. Self-talk is some better except under heightened stress. Goal review and progress noted.  Next appt within 2-3 weeks.   Shanon Ace, LCSW

## 2020-03-14 ENCOUNTER — Encounter: Payer: Self-pay | Admitting: Adult Health

## 2020-03-14 ENCOUNTER — Ambulatory Visit (INDEPENDENT_AMBULATORY_CARE_PROVIDER_SITE_OTHER): Payer: 59 | Admitting: Adult Health

## 2020-03-14 DIAGNOSIS — G4700 Insomnia, unspecified: Secondary | ICD-10-CM

## 2020-03-14 DIAGNOSIS — F411 Generalized anxiety disorder: Secondary | ICD-10-CM | POA: Diagnosis not present

## 2020-03-14 DIAGNOSIS — F410 Panic disorder [episodic paroxysmal anxiety] without agoraphobia: Secondary | ICD-10-CM | POA: Diagnosis not present

## 2020-03-14 MED ORDER — CITALOPRAM HYDROBROMIDE 20 MG PO TABS: ORAL_TABLET | ORAL | 1 refills | Status: DC

## 2020-03-14 MED ORDER — LORAZEPAM 0.5 MG PO TABS: ORAL_TABLET | ORAL | 2 refills | Status: DC

## 2020-03-14 NOTE — Progress Notes (Signed)
Jasmine Greene [id number] [birth year] 24 y.o.  Subjective:   Patient ID:  Jasmine Greene is a 24 y.o. (DOB 03/26/1996) female.  Chief Complaint: No chief complaint on file.   HPI Jasmine Greene presents to the office today for follow-up of GAD, MDD, and panic attacks.   Describes mood today as "ok". Pleasant. Mood symptoms - denies depression and irritability. Feels anxious "a little above some, but not all the time". Using Lorazepam as needed for anxiety/panic attacks. Forgot her Celexa recently when she went out of town and would like to restart it. Stable interest and motivation. Taking medications as prescribed.  Energy levels stable. Active, does not have a regular exercise routine. 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  Enjoys some usual interests and activities. Single. Lives alone with dog - "Bear". Parents local. Spending time with family. Recent beach trip. Appetite adequate. Weight stable - 108 pounds. Sleeps well most nights. Averages 6 hours. Up and down during the night - 2 hour gap of not sleeping. Focus and concentration stable. Completing tasks. 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 Denies SI or HI. Denies AH or VH.  Previous medication trials: Celexa   Review of Systems:  Review of Systems  Musculoskeletal: Negative for gait problem.  Neurological: Negative for tremors.  Psychiatric/Behavioral:       Please refer to HPI    Medications: I have reviewed the patient's current medications.  Current Outpatient Medications  Medication Sig Dispense Refill  . citalopram (CELEXA) 10 MG tablet Take one and 1/2 tablets daily x 7 days, then take two tablets daily. 60 tablet 5  . drospirenone-ethinyl estradiol (YAZ,GIANVI,LORYNA) 3-0.02 MG tablet Take 1 tablet by mouth daily.    Marland Kitchen LORazepam (ATIVAN) 0.5 MG tablet Take one tablet twice daily as needed for anxiety/panic. 60 tablet 2  .  METRONIDAZOLE IN NACL IV Inject into the vein.     No current facility-administered medications for this visit.    Medication Side Effects: None  Allergies:  Allergies  Allergen Reactions  . Penicillins     Past Medical History:  Diagnosis Date  . Anxiety     Family History  Problem Relation Age of Onset  . Breast cancer Maternal Grandmother     Social History   Socioeconomic History  . Marital status: Single    Spouse name: Not on file  . Number of children: Not on file  . Years of education: Not on file  . Highest education level: Not on file  Occupational History  . Not on file  Tobacco Use  . Smoking status: Never Smoker  . Smokeless tobacco: Never Used  Substance and Sexual Activity  . Alcohol use: Yes    Comment: occasionally  . Drug use: Not Currently    Types: Marijuana    Comment: freshman year of high school  . Sexual activity: Never  Other Topics Concern  . Not on file  Social History Narrative  . Not on file   Social Determinants of Health   Financial Resource Strain:   . Difficulty of Paying Living Expenses:   Food Insecurity:   . Worried About Charity fundraiser in the Last Year:   . Arboriculturist in the Last Year:   Transportation Needs:   . Film/video editor (Medical):   Marland Kitchen Lack of Transportation (Non-Medical):   Physical Activity:   . Days of Exercise per Week:   .  Minutes of Exercise per Session:   Stress:   . Feeling of Stress :   Social Connections:   . Frequency of Communication with Friends and Family:   . Frequency of Social Gatherings with Friends and Family:   . Attends Religious Services:   . Active Member of Clubs or Organizations:   . Attends Banker Meetings:   Marland Kitchen Marital Status:   Intimate Partner Violence:   . Fear of Current or Ex-Partner:   . Emotionally Abused:   Marland Kitchen Physically Abused:   . Sexually Abused:     Past Medical History, Surgical history, Social history, and Family history were  reviewed and updated as appropriate.   Please see review of systems for further details on the patient's review from today.   Objective:   Physical Exam:  There were no vitals taken for this visit.  Physical Exam Constitutional:      General: She is not in acute distress. Musculoskeletal:        General: No deformity.  Neurological:     Mental Status: She is alert and oriented to person, place, and time.     Coordination: Coordination normal.  Psychiatric:        Attention and Perception: Attention and perception normal. She does not perceive auditory or visual hallucinations.        Mood and Affect: Mood is anxious. Mood is not depressed. Affect is not labile, blunt, angry or inappropriate.        Speech: Speech normal.        Behavior: Behavior normal.        Thought Content: Thought content normal. Thought content is not paranoid or delusional. Thought content does not include homicidal or suicidal ideation. Thought content does not include homicidal or suicidal plan.        Cognition and Memory: Cognition and memory normal.        Judgment: Judgment normal.     Comments: Insight intact     Lab Review:  No results found for: NA, K, CL, CO2, GLUCOSE, BUN, CREATININE, CALCIUM, PROT, ALBUMIN, AST, ALT, ALKPHOS, BILITOT, GFRNONAA, GFRAA  No results found for: WBC, RBC, HGB, HCT, PLT, MCV, MCH, MCHC, RDW, LYMPHSABS, MONOABS, EOSABS, BASOSABS  No results found for: POCLITH, LITHIUM   No results found for: PHENYTOIN, PHENOBARB, VALPROATE, CBMZ   .res Assessment: Plan:    Plan:  PDMP reviewed  1. Restart Celexa 20mg  - 1/2 tablet daily x 7 days, then 20mg . 2. Lorazepam 0.5mg  BID prn anxiety  RTC 3 months  Patient advised to contact office with any questions, adverse effects, or acute worsening in signs and symptoms.  Discussed potential benefits, risk, and side effects of benzodiazepines to include potential risk of tolerance and dependence, as well as possible  drowsiness.  Advised patient not to drive if experiencing drowsiness and to take lowest possible effective dose to minimize risk of dependence and tolerance.   There are no diagnoses linked to this encounter.   Please see After Visit Summary for patient specific instructions.  Future Appointments  Date Time Provider Department Center  03/24/2020 10:00 AM , LCSW [facility] None  04/07/2020 10:00 AM Mathis Fare, LCSW [facility] None  04/21/2020  9:00 AM Mathis Fare, LCSW [facility] None    No orders of the defined types were placed in this encounter.   -------------------------------

## 2020-03-24 ENCOUNTER — Ambulatory Visit: Payer: 59 | Admitting: Psychiatry

## 2020-04-07 ENCOUNTER — Ambulatory Visit: Payer: 59 | Admitting: Psychiatry

## 2020-04-21 ENCOUNTER — Ambulatory Visit (INDEPENDENT_AMBULATORY_CARE_PROVIDER_SITE_OTHER): Payer: 59 | Admitting: Psychiatry

## 2020-04-21 ENCOUNTER — Other Ambulatory Visit: Payer: Self-pay

## 2020-04-21 DIAGNOSIS — F411 Generalized anxiety disorder: Secondary | ICD-10-CM

## 2020-04-21 NOTE — Progress Notes (Signed)
      [facility] Counselor/Therapist Progress Note  Patient ID: Jasmine Greene, MRN: [id number],    Date: 04/21/2020  Time Spent: 60 minutes  9:00am to 10:00am  Treatment Type: Individual Therapy  Reported Symptoms: anxiety  Mental Status Exam:  Appearance:   Casual     Behavior:  Appropriate and Sharing  Motor:  Normal  Speech/Language:   Normal Rate  Affect:  anxious  Mood:  anxious  Thought process:  goal directed  Thought content:    WNL  Sensory/Perceptual disturbances:    WNL  Orientation:  oriented to person, place, time/date, situation, day of week, month of year and year  Attention:  Good  Concentration:  Good and Fair  Memory:  WNL  Fund of knowledge:   Good  Insight:    Good  Judgment:   Good  Impulse Control:  Good   Risk Assessment: Danger to Self:  No Self-injurious Behavior: No Danger to Others: No Duty to Warn:no Physical Aggression / Violence:No  Access to Firearms a concern: No  Gang Involvement:No   Subjective: Patient today reporting anxiety and has "not done well with making changes."  Interventions: Solution-Oriented/Positive Psychology and Ego-Supportive  Diagnosis:   ICD-10-CM   1. GAD (generalized anxiety disorder)  F41.1      Plan of Care:  Patient not signing tx plan on computer screen due to COVID.  Treatment Goals: Goals will remain on tx plan as patiet works with strategies to meet her goal. Progress will be noted each visit on "Progress" section of Plan.  Long term goal: Reduce overall level, frequency, and intensity of the anxiety so that daily functioning is not impaired.  Short term goal: Increase understanding of beliefs and messages that produce worry and anxiety.  Strategy: Identify, challenge, and replace fearful/negative/anxious self-talk with positive, reality-based, empowering self-talk.  Progress: Patient in today reporting anxiety and still trying to move forward.  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 States she is not procrastinating as much.  She wants to move to take a job in [place], but parents want her to stay here. Job openings in July that she hopes to apply for.  Shares that her parents love her but not in healthy ways. They are against me making decisions that don't match up with what they think.  They "shame" me and"y to make me feel guilty." States mom is narcissist and very controlling. "I need to stop being a lazy butt and make myself do some changing." Self-talk "may be some better", but is often self-defeating. Wanting to take her "making changes more seriously" and we discussed how this can happen. Overthinking.Worked more specifically on motivation today.  Reviewed her goals above and she is choosing to focus more specifically and work on increasing her understanding of the beliefs and messages that tend to most lead her to feel anxious and "ends up blocking my efforts to make changes."  Will follow up on this next session and she is to keep notes in the meantime on her goal-directed behaviors.  Goal review and progress and challenges noted with patient.  Next visit within 2-3 weeks.   Mathis Fare, LCSW

## 2020-05-27 IMAGING — CT CT HEAD W/O CM
1 series · 16 of 30 positions shown, 20 images · non-contrast
Comparison: None.

CLINICAL DATA: Blurred vision.

EXAM:
CT HEAD WITHOUT CONTRAST
TECHNIQUE: Contiguous axial images were obtained from the base of the skull
through the vertex without intravenous contrast.

[Series 2: head w/(date) · axial · 0.49mm/px · z∈[-168,-33]mm · 16 of 31 slices shown, 20 images]
[im 2/31  brain]
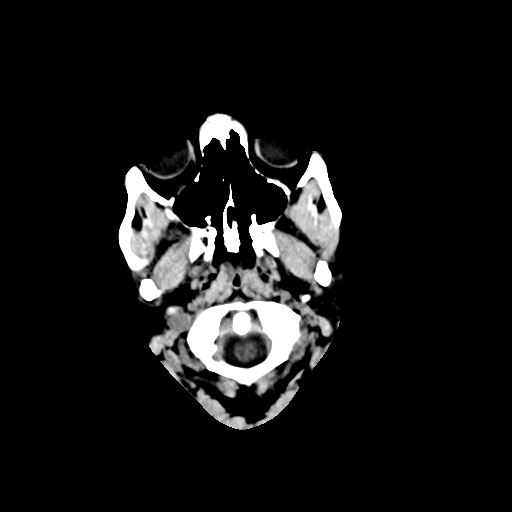
[im 2/31  bone]
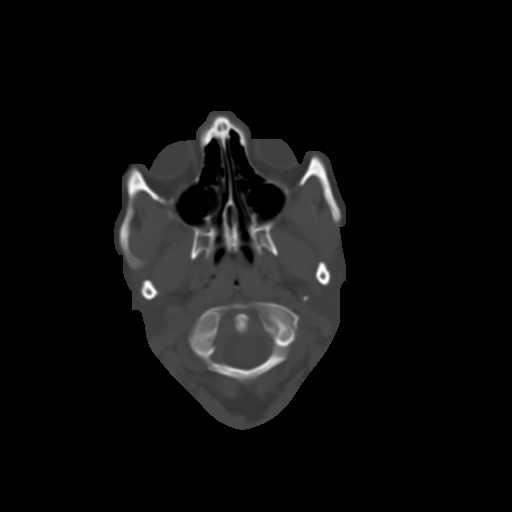
[im 4/31  brain]
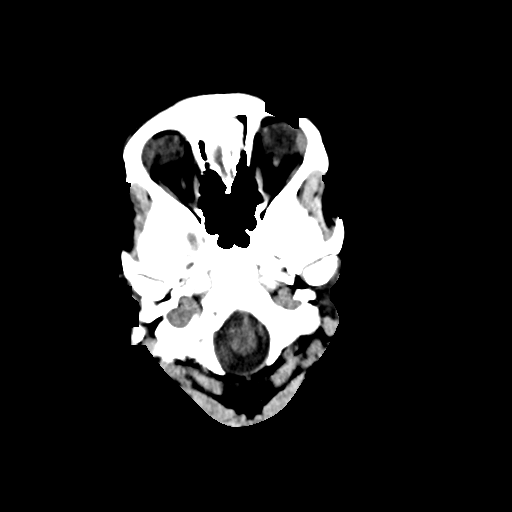
[im 6/31  brain]
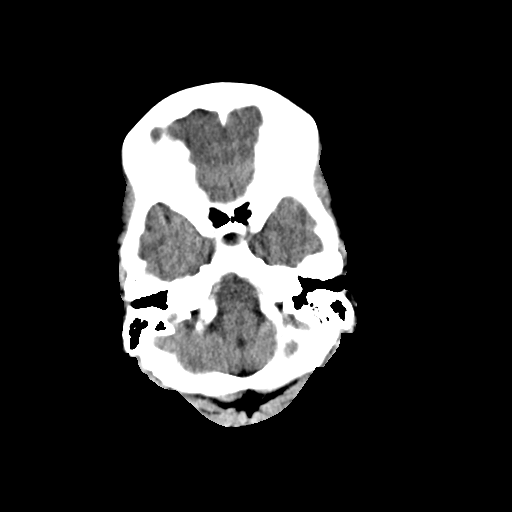
[im 8/31  brain]
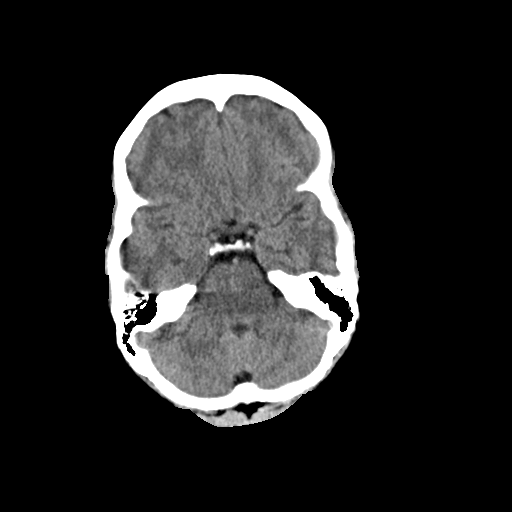
[im 9/31  brain]
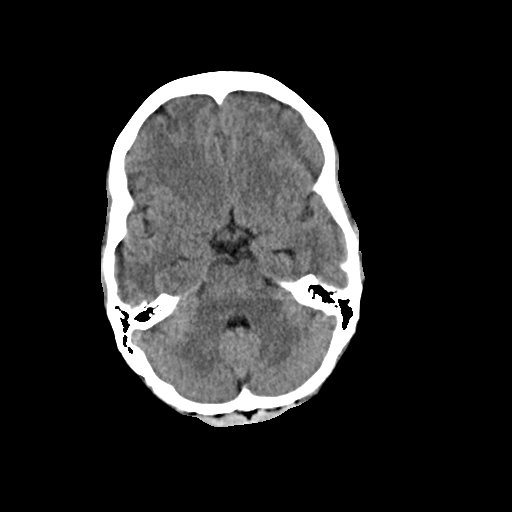
[im 9/31  bone]
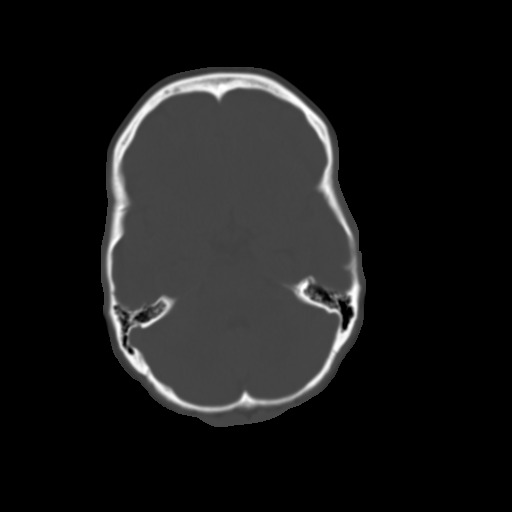
[im 11/31  brain]
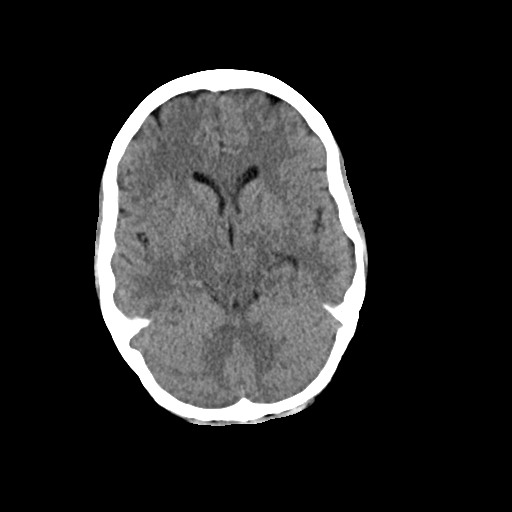
[im 13/31  brain]
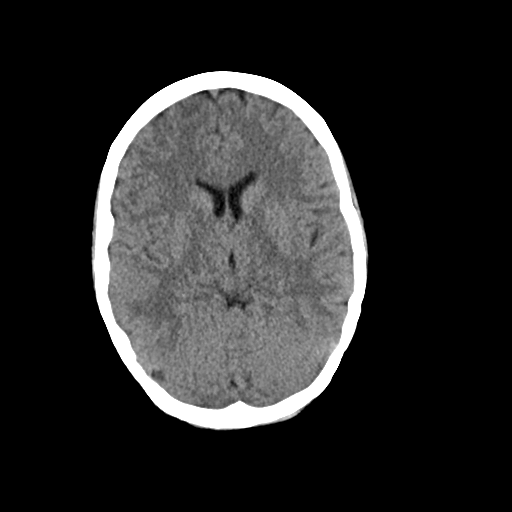
[im 15/31  brain]
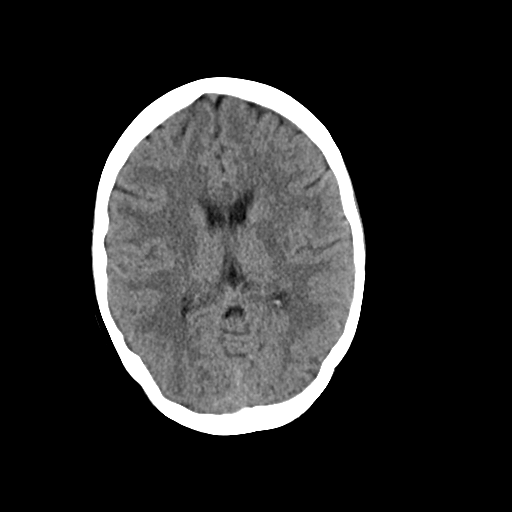
[im 16/31  brain]
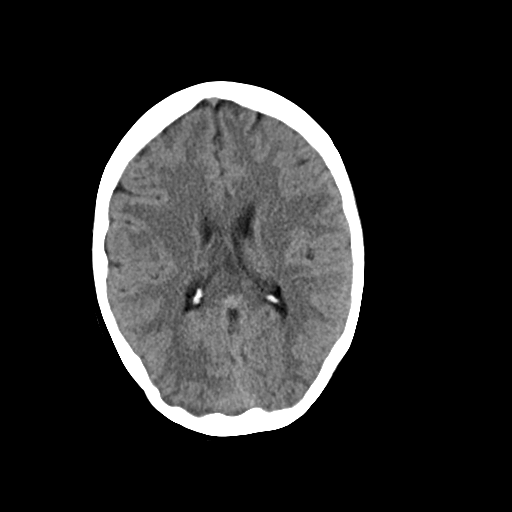
[im 16/31  bone]
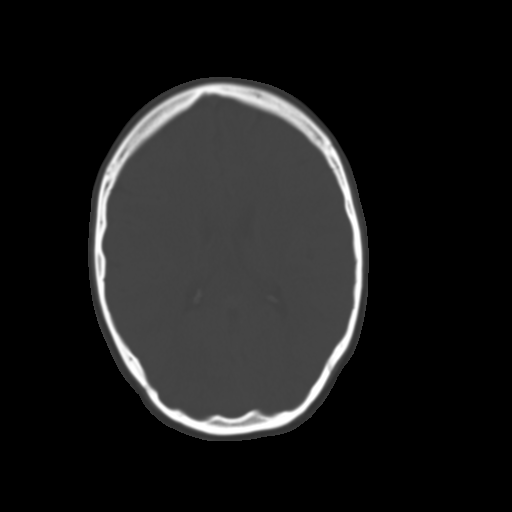
[im 18/31  brain]
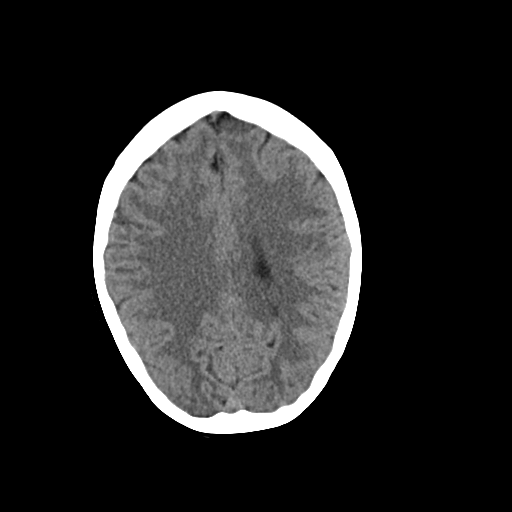
[im 20/31  brain]
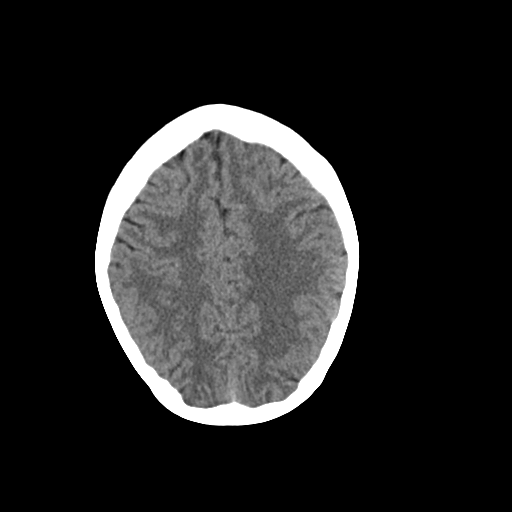
[im 22/31  brain]
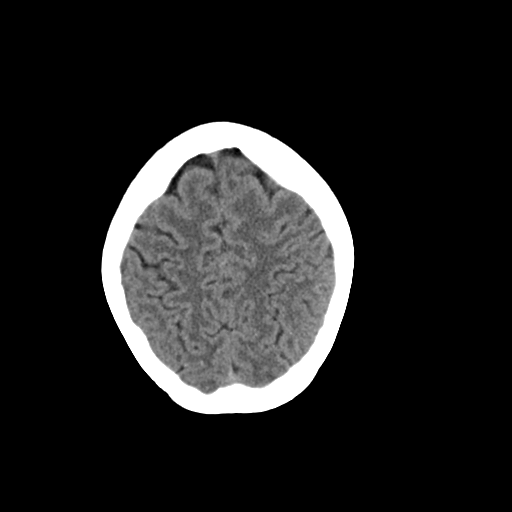
[im 23/31  brain]
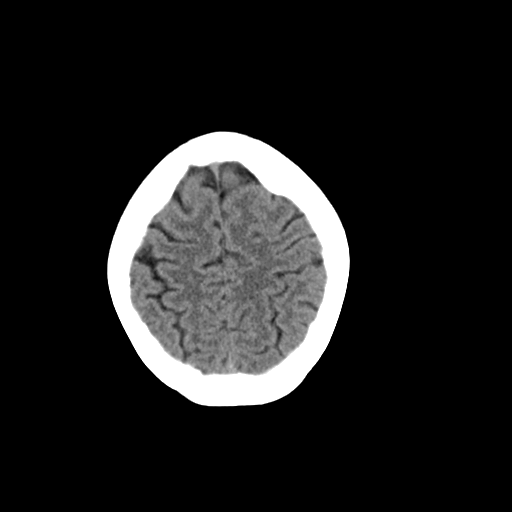
[im 23/31  bone]
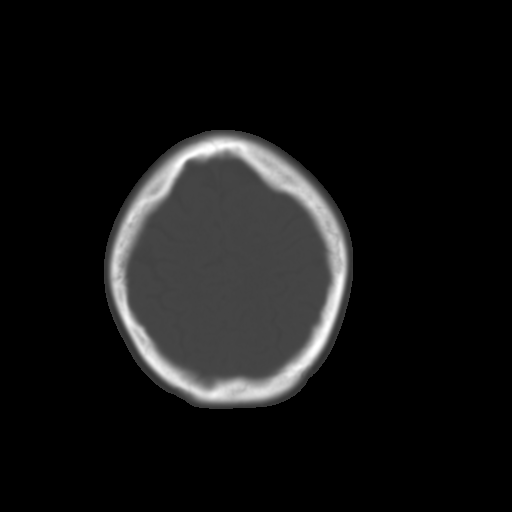
[im 25/31  brain]
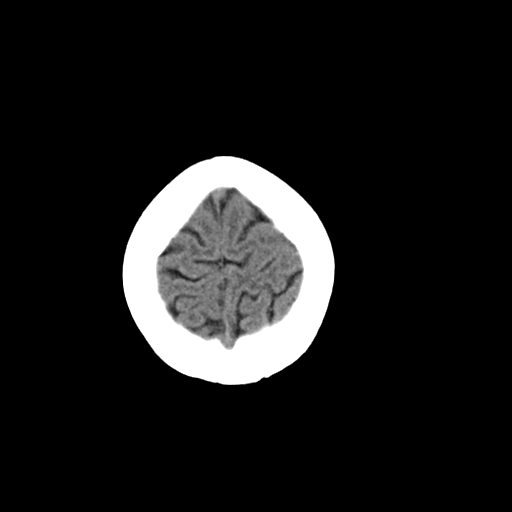
[im 27/31  brain]
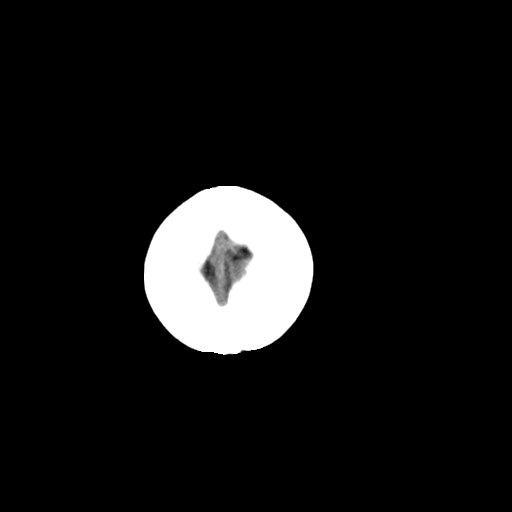
[im 29/31  brain]
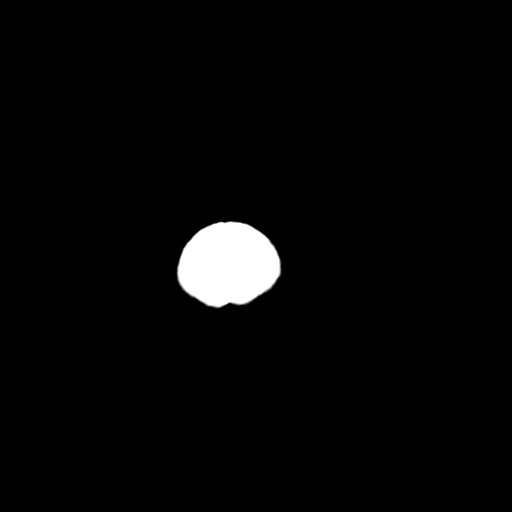

[16 of 30 positions shown; findings below may reference images not displayed]

FINDINGS: Brain: No evidence of acute infarction, hemorrhage, hydrocephalus,
extra-axial collection or mass lesion/mass effect.

Vascular: No hyperdense vessel or unexpected calcification.

Skull: Normal. Negative for fracture or focal lesion.

Sinuses/Orbits: No acute finding.

Other: None.
IMPRESSION: Normal head CT.

## 2020-05-30 ENCOUNTER — Ambulatory Visit: Payer: 59 | Admitting: Psychiatry

## 2020-06-08 ENCOUNTER — Telehealth: Payer: Self-pay | Admitting: Adult Health

## 2020-06-08 NOTE — Telephone Encounter (Signed)
Pt needs an updated Emotional Support animal letter because she is moving.  She is moving to [place]. 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

## 2020-06-08 NOTE — Telephone Encounter (Signed)
Completed.

## 2020-06-13 ENCOUNTER — Other Ambulatory Visit: Payer: Self-pay

## 2020-06-13 ENCOUNTER — Encounter: Payer: Self-pay | Admitting: Adult Health

## 2020-06-13 ENCOUNTER — Ambulatory Visit (INDEPENDENT_AMBULATORY_CARE_PROVIDER_SITE_OTHER): Payer: 59 | Admitting: Adult Health

## 2020-06-13 ENCOUNTER — Ambulatory Visit: Payer: 59 | Admitting: Adult Health

## 2020-06-13 DIAGNOSIS — F411 Generalized anxiety disorder: Secondary | ICD-10-CM

## 2020-06-13 DIAGNOSIS — G4700 Insomnia, unspecified: Secondary | ICD-10-CM

## 2020-06-13 DIAGNOSIS — F410 Panic disorder [episodic paroxysmal anxiety] without agoraphobia: Secondary | ICD-10-CM | POA: Diagnosis not present

## 2020-06-13 MED ORDER — CITALOPRAM HYDROBROMIDE 20 MG PO TABS: ORAL_TABLET | ORAL | 1 refills | Status: AC

## 2020-06-13 MED ORDER — LORAZEPAM 0.5 MG PO TABS: ORAL_TABLET | ORAL | 2 refills | Status: AC

## 2020-06-13 NOTE — Progress Notes (Signed)
Jasmine Greene [id number] 06/22/1996 24 y.o.  Subjective:   Patient ID:  Jasmine Greene is a 24 y.o. (DOB 1996-04-24) female.  Chief Complaint: No chief complaint on file.   HPI Darnetta Kesselman Pavlak presents to the office today for follow-up of GAD, MDD, and panic attacks.   Describes mood today as "ok". Pleasant. Mood symptoms - denies depression, anxiety, and irritability. Stating "I'm doing alright". Feels like the Celexa is working well. Taking the Lorazepam every morning after having a panic attack..Taking the Celexa consistently. Plans to move to [place] later this year. Stable interest and motivation. Taking medications as prescribed.  Energy levels lower - "feels tired". Active, does not have a regular exercise routine. Walking some days. Enjoys some usual interests and activities. Single. Lives alone with dog - "Bear". Parents local. Spending time with family.  Appetite adequate. Weight gain - 119 pounds. Sleeps well most nights. Averages 6 hours. Napping during the day.   Focus and concentration stable. Completing tasks. 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  Denies SI or HI. Denies AH or VH.  Previous medication trials: Celexa   Review of Systems:  Review of Systems  Musculoskeletal: Negative for gait problem.  Neurological: Negative for tremors.  Psychiatric/Behavioral:       Please refer to HPI    Medications: I have reviewed the patient's current medications.  Current Outpatient Medications  Medication Sig Dispense Refill  . citalopram (CELEXA) 20 MG tablet Take one and 1/2 tablets daily x 7 days, then take two tablets daily. 90 tablet 1  . drospirenone-ethinyl estradiol (YAZ,GIANVI,LORYNA) 3-0.02 MG tablet Take 1 tablet by mouth daily.    Marland Kitchen LORazepam (ATIVAN) 0.5 MG tablet Take one tablet twice daily as needed for anxiety/panic. 60 tablet 2  . METRONIDAZOLE IN NACL IV Inject into the vein.      No current facility-administered medications for this visit.    Medication Side Effects: None  Allergies:  Allergies  Allergen Reactions  . Penicillins     Past Medical History:  Diagnosis Date  . Anxiety     Family History  Problem Relation Age of Onset  . Breast cancer Maternal Grandmother     Social History   Socioeconomic History  . Marital status: Single    Spouse name: Not on file  . Number of children: Not on file  . Years of education: Not on file  . Highest education level: Not on file  Occupational History  . Not on file  Tobacco Use  . Smoking status: Never Smoker  . Smokeless tobacco: Never Used  Substance and Sexual Activity  . Alcohol use: Yes    Comment: occasionally  . Drug use: Not Currently    Types: Marijuana    Comment: freshman year of high school  . Sexual activity: Never  Other Topics Concern  . Not on file  Social History Narrative  . Not on file   Social Determinants of Health   Financial Resource Strain:   . Difficulty of Paying Living Expenses:   Food Insecurity:   . Worried About Programme researcher, broadcasting/film/video in the Last Year:   . Barista in the Last Year:   Transportation Needs:   . Freight forwarder (Medical):   Marland Kitchen Lack of Transportation (Non-Medical):   Physical Activity:   . Days of Exercise per Week:   . Minutes of Exercise per Session:   Stress:   . Feeling of Stress :  Social Connections:   . Frequency of Communication with Friends and Family:   . Frequency of Social Gatherings with Friends and Family:   . Attends Religious Services:   . Active Member of Clubs or Organizations:   . Attends Banker Meetings:   Marland Kitchen Marital Status:   Intimate Partner Violence:   . Fear of Current or Ex-Partner:   . Emotionally Abused:   Marland Kitchen Physically Abused:   . Sexually Abused:     Past Medical History, Surgical history, Social history, and Family history were reviewed and updated as appropriate.   Please  see review of systems for further details on the patient's review from today.   Objective:   Physical Exam:  There were no vitals taken for this visit.  Physical Exam Constitutional:      General: She is not in acute distress. Musculoskeletal:        General: No deformity.  Neurological:     Mental Status: She is alert and oriented to person, place, and time.     Coordination: Coordination normal.  Psychiatric:        Attention and Perception: Attention and perception normal. She does not perceive auditory or visual hallucinations.        Mood and Affect: Mood normal. Mood is not anxious or depressed. Affect is not labile, blunt, angry or inappropriate.        Speech: Speech normal.        Behavior: Behavior normal.        Thought Content: Thought content normal. Thought content is not paranoid or delusional. Thought content does not include homicidal or suicidal ideation. Thought content does not include homicidal or suicidal plan.        Cognition and Memory: Cognition and memory normal.        Judgment: Judgment normal.     Comments: Insight intact     Lab Review:  No results found for: NA, K, CL, CO2, GLUCOSE, BUN, CREATININE, CALCIUM, PROT, ALBUMIN, AST, ALT, ALKPHOS, BILITOT, GFRNONAA, GFRAA  No results found for: WBC, RBC, HGB, HCT, PLT, MCV, MCH, MCHC, RDW, LYMPHSABS, MONOABS, EOSABS, BASOSABS  No results found for: POCLITH, LITHIUM   No results found for: PHENYTOIN, PHENOBARB, VALPROATE, CBMZ   .res Assessment: Plan:    Plan:  PDMP reviewed  1. Celexa 20mg  daily. 2. Lorazepam 0.5mg  BID prn anxiety  RTC 3 months  Patient advised to contact office with any questions, adverse effects, or acute worsening in signs and symptoms.  Discussed potential benefits, risk, and side effects of benzodiazepines to include potential risk of tolerance and dependence, as well as possible drowsiness.  Advised patient not to drive if experiencing drowsiness and to take lowest  possible effective dose to minimize risk of dependence and tolerance.    Diagnoses and all orders for this visit:  GAD (generalized anxiety disorder)  Panic attacks -     citalopram (CELEXA) 20 MG tablet; Take one and 1/2 tablets daily x 7 days, then take two tablets daily. -     LORazepam (ATIVAN) 0.5 MG tablet; Take one tablet twice daily as needed for anxiety/panic.  Insomnia, unspecified type     Please see After Visit Summary for patient specific instructions.  Future Appointments  Date Time Provider Department Center  06/27/2020  4:00 PM 08/27/2020, LCSW [facility] None    No orders of the defined types were placed in this encounter.   -------------------------------

## 2020-06-27 ENCOUNTER — Other Ambulatory Visit: Payer: Self-pay

## 2020-06-27 ENCOUNTER — Ambulatory Visit (INDEPENDENT_AMBULATORY_CARE_PROVIDER_SITE_OTHER): Payer: 59 | Admitting: Psychiatry

## 2020-06-27 DIAGNOSIS — F411 Generalized anxiety disorder: Secondary | ICD-10-CM | POA: Diagnosis not present

## 2020-06-27 NOTE — Progress Notes (Signed)
      [facility] Counselor/Therapist Progress Note  Patient ID: Jasmine Greene, MRN: [id number],    Date: 06/27/2020  Time Spent: 50 minutes   4:05pm to 5:05pm  Treatment Type: Individual Therapy  Reported Symptoms:  Anxiety, nervous about new job in [place]  Mental Status Exam:  Appearance:   Casual     Behavior:  Appropriate, Sharing and Motivated  Motor:  Normal  Speech/Language:   Clear and Coherent  Affect:  anxious  Mood:  anxious  Thought process:  normal  Thought content:    WNL  Sensory/Perceptual disturbances:    WNL  Orientation:  oriented to person, place, time/date, situation, day of week, month of year and year  Attention:  Good  Concentration:  Good and Fair  Memory:  WNL  Fund of knowledge:   Good  Insight:    Good and Fair  Judgment:   Good  Impulse Control:  Good and Fair   Risk Assessment: Danger to Self:  No Self-injurious Behavior: No Danger to Others: No Duty to Warn:no Physical Aggression / Violence:No  Access to Firearms a concern: No  Gang Involvement:No   Subjective: Patient today reports anxiety as well as excitement about new upcoming job in [place].  Interventions: Solution-Oriented/Positive Psychology and Ego-Supportive  Diagnosis:   ICD-10-CM   1. GAD (generalized anxiety disorder)  F41.1      Plan of Care:  Patient not signing tx plan on computer screen due to COVID.  Treatment Goals: Goals will remain on tx plan as patiet works with strategies to meet her goal. Progress will be noted each visit on "Progress" section of Plan.  Long term goal: Reduce overall level, frequency, and intensity of the anxiety so that daily functioning is not impaired.  Short term goal: Increase understanding of beliefs and messages that produce worry and anxiety.  Strategy: Identify, challenge, and replace fearful/negative/anxious self-talk with positive, reality-based, empowering self-talk.  Progress: Patient today  presents with anxiety and "nervousness" about her new job in [place] starting in mid-September.  Patient presents very anxious and nervous about this but it is also something she is dreamed about for quite a while and hoping she would get an opportunity.  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  Reports that she is getting much better with her not procrastinating.  Her parents are cautiously supportive of her "only because they do not want me to live that far away".  Is concentrating on more positive self talk as well as keeping her motivation improved.  Currently, her overthinking is happening often with the current anxiety level about new job.  Initially today, she was thinking much more about "what might go wrong versus what might go right" and we worked on reversing that in session today.  Patient is to work on more quickly identifying her anxious, fearful thoughts and interrupt them in order to replace them with more positive, reality based, and empowering thoughts and self talk.   Longer term goal continues to be reducing overall level, frequency, and intensity of her anxiety so that daily functioning is not impaired.  Will see her again in 2 to 3 weeks.  Goal review and progress noted with patient.  Next appt within 3 weeks.   Mathis Fare, LCSW

## 2020-07-07 ENCOUNTER — Ambulatory Visit (INDEPENDENT_AMBULATORY_CARE_PROVIDER_SITE_OTHER): Payer: 59 | Admitting: Psychiatry

## 2020-07-07 ENCOUNTER — Other Ambulatory Visit: Payer: Self-pay

## 2020-07-07 DIAGNOSIS — F411 Generalized anxiety disorder: Secondary | ICD-10-CM

## 2020-07-07 NOTE — Progress Notes (Signed)
[facility] Counselor/Therapist Progress Note  Patient ID: Jasmine Greene, MRN: [id number],    Date: 07/07/2020  Time Spent: 50  minutes   2:00pm to 2.50pm  Treatment Type: Individual Therapy  Reported Symptoms: anxiety re: moving to [place] for new job  Mental Status Exam:  Appearance:   Casual     Behavior:  Appropriate, Sharing and Motivated  Motor:  Normal  Speech/Language:   Clear and Coherent  Affect:  anxious  Mood:  anxious  Thought process:  goal directed  Thought content:    WNL  Sensory/Perceptual disturbances:    WNL  Orientation:  oriented to person, place, time/date, situation, day of week, month of year and year  Attention:  Good  Concentration:  Good and Fair  Memory:  WNL  Fund of knowledge:   Good  Insight:    Good  Judgment:   Good  Impulse Control:  Good   Risk Assessment: Danger to Self:  No Self-injurious Behavior: No Danger to Others: No Duty to Warn:no Physical Aggression / Violence:No  Access to Firearms a concern: No  Gang Involvement:No   Subjective: Patient in today reporting anxiety, stressed, "but excited". To move Sept 11th. Nervous about all the changes she will encounter.   Interventions: Cognitive Behavioral Therapy and Solution-Oriented/Positive Psychology  Diagnosis:   ICD-10-CM   1. GAD (generalized anxiety disorder)  F41.1      Plan of Care:  Patient not signing tx plan on computer screen due to COVID.  Treatment Goals: Goals will remain on tx plan as patiet works with strategies to meet her goal. Progress will be noted each visit on "Progress" section of Plan.  Long term goal: Reduce overall level, frequency, and intensity of the anxiety so that daily functioning is not impaired.  Short term goal: Increase understanding of beliefs and messages that produce worry and anxiety.  Strategy: Identify, challenge, and replace fearful/negative/anxious self-talk with positive, reality-based, empowering  self-talk.  Progress: Patient in today reporting anxiety and nervousness about her upcoming new job and preparing to move to [place].  Discussed her "most pressing anxieties/fears" including: hoping her apt chosen online will work out ok, hoping her job will be a good fit, making friends, and "just overall adjusting".  We worked on each of these areas during session and patient responded well.  She expressed openly several of her fears and anxieties and was able to talk through them and how some of what she was thinking were worse case scenarios that would not be very likely to happen.  Also discussed, in case something did arise that was difficult for her to handle, some best strategies for her in dealing with obstacles including reaching out for help as needed.  She did seem to feel more grounded by session and and actually talked about some of the things from the local area that she is going to miss including her family and some of the kids and coworkers that she has worked with this summer at [employer].  Discussed further her anxiety about living so far away from home and she also acknowledged how she is always wanted to take a big step like this and likes the [place] area with all of the snowboarding and skiing offered there.  Patient does have return appointment just prior to her leaving town to go to [place].  She is to practice skills using her long-term goal, short-term goa, and strategy above in her treatment plan, over the next couple of  weeks before next appointment.  Goal review and progress/challenges noted with patient.  Next appt within 2 weeks.   Mathis Fare, LCSW

## 2020-07-28 ENCOUNTER — Ambulatory Visit: Payer: 59 | Admitting: Psychiatry

## 2020-08-21 ENCOUNTER — Other Ambulatory Visit: Payer: Self-pay | Admitting: Adult Health

## 2020-08-21 DIAGNOSIS — F411 Generalized anxiety disorder: Secondary | ICD-10-CM

## 2020-08-21 DIAGNOSIS — F410 Panic disorder [episodic paroxysmal anxiety] without agoraphobia: Secondary | ICD-10-CM

## 2021-10-14 ENCOUNTER — Emergency Department (HOSPITAL_BASED_OUTPATIENT_CLINIC_OR_DEPARTMENT_OTHER): Payer: BC Managed Care – PPO

## 2021-10-14 ENCOUNTER — Emergency Department (HOSPITAL_BASED_OUTPATIENT_CLINIC_OR_DEPARTMENT_OTHER)
Admission: EM | Admit: 2021-10-14 | Discharge: 2021-10-14 | Disposition: A | Discharge status: Home / Self Care | Payer: BC Managed Care – PPO | Attending: Emergency Medicine | Admitting: Emergency Medicine

## 2021-10-14 ENCOUNTER — Encounter (HOSPITAL_BASED_OUTPATIENT_CLINIC_OR_DEPARTMENT_OTHER): Payer: Self-pay

## 2021-10-14 ENCOUNTER — Other Ambulatory Visit: Payer: Self-pay

## 2021-10-14 DIAGNOSIS — M25561 Pain in right knee: Secondary | ICD-10-CM | POA: Diagnosis not present

## 2021-10-14 DIAGNOSIS — R21 Rash and other nonspecific skin eruption: Secondary | ICD-10-CM | POA: Diagnosis not present

## 2021-10-14 DIAGNOSIS — M79604 Pain in right leg: Secondary | ICD-10-CM | POA: Diagnosis present

## 2021-10-14 LAB — CBC WITH DIFFERENTIAL/PLATELET
Abs Immature Granulocytes: 0.02 10*3/uL (ref 0.00–0.07)
Basophils Absolute: 0.1 10*3/uL (ref 0.0–0.1)
Basophils Relative: 1 %
Eosinophils Absolute: 0.2 10*3/uL (ref 0.0–0.5)
Eosinophils Relative: 3 %
HCT: 40.9 % (ref 36.0–46.0)
Hemoglobin: 13.9 g/dL (ref 12.0–15.0)
Immature Granulocytes: 0 %
Lymphocytes Relative: 29 %
Lymphs Abs: 2.2 10*3/uL (ref 0.7–4.0)
MCH: 32 pg (ref 26.0–34.0)
MCHC: 34 g/dL (ref 30.0–36.0)
MCV: 94.2 fL (ref 80.0–100.0)
Monocytes Absolute: 0.3 10*3/uL (ref 0.1–1.0)
Monocytes Relative: 4 %
Neutro Abs: 4.7 10*3/uL (ref 1.7–7.7)
Neutrophils Relative %: 63 %
Platelets: 383 10*3/uL (ref 150–400)
RBC: 4.34 MIL/uL (ref 3.87–5.11)
RDW: 11.8 % (ref 11.5–15.5)
WBC: 7.5 10*3/uL (ref 4.0–10.5)
nRBC: 0 % (ref 0.0–0.2)

## 2021-10-14 LAB — BASIC METABOLIC PANEL
Anion gap: 11 (ref 5–15)
BUN: 13 mg/dL (ref 6–20)
CO2: 22 mmol/L (ref 22–32)
Calcium: 10.1 mg/dL (ref 8.9–10.3)
Chloride: 104 mmol/L (ref 98–111)
Creatinine, Ser: 0.72 mg/dL (ref 0.44–1.00)
GFR, Estimated: >60 mL/min (ref >60)
Glucose, Bld: 88 mg/dL (ref 70–99)
Potassium: 4 mmol/L (ref 3.5–5.1)
Sodium: 137 mmol/L (ref 135–145)

## 2021-10-14 LAB — HCG, SERUM, QUALITATIVE: Preg, Serum: NEGATIVE

## 2021-10-14 MED ORDER — KETOROLAC TROMETHAMINE 15 MG/ML IJ SOLN: 15.0000 mg | Freq: Once | INTRAMUSCULAR | Status: DC

## 2021-10-14 MED ORDER — KETOROLAC TROMETHAMINE 15 MG/ML IJ SOLN
15.0000 mg | Freq: Once | INTRAMUSCULAR | Status: DC
  Filled 2021-10-14: qty 1

## 2021-10-14 NOTE — Discharge Instructions (Signed)
Please follow up with your orthopedic doctor next week as scheduled   Please return to the emergency department for any new or worsening symptoms.

## 2021-10-14 NOTE — ED Triage Notes (Signed)
She tells me she had right knee arthroscopy about a week ago in [place]. She is here today with a fine red raised rash around her operative knee with some minimal swelling and "pressure" of her right knee. She ambulates with the assistance of her own crutches.

## 2021-10-14 NOTE — ED Provider Notes (Addendum)
[facility] EMERGENCY DEPT Provider Note   CSN: [id number] Arrival date & time: 10/14/21  1042     History Chief Complaint  Patient presents with   right knee post-op rash    Jasmine Greene is a 25 y.o. female.  HPI  25 year old female the history of anxiety presents to the emergency department today for evaluation of right knee pain and swelling.  She is here from out of town from [place].  On [id number] she had a plica removed from the right knee by Dr. Evelena Leyden.  States she had been doing well following the procedure however noted increased swelling and pain starting on 11/22.  Since then she has had decreased range of motion of the right knee and she has also experienced a rash to the right thigh area.  She used some over-the-counter hydrocortisone cream which did improve the rash and itching but the swelling and pain persist.  Denies any fevers chills or other systemic symptoms.  Past Medical History:  Diagnosis Date   Anxiety     Patient Active Problem List   Diagnosis Date Noted   GAD (generalized anxiety disorder) 07/30/2019   Panic attacks 07/30/2019   Insomnia 07/30/2019   Attention deficit hyperactivity disorder (ADHD) 07/30/2019    Past Surgical History:  Procedure Laterality Date   KNEE ARTHROSCOPY     LAPAROSCOPY       OB History   No obstetric history on file.     Family History  Problem Relation Age of Onset   Breast cancer Maternal Grandmother     Social History   Tobacco Use   Smoking status: Never   Smokeless tobacco: Never  Substance Use Topics   Alcohol use: Yes    Comment: occasionally   Drug use: Not Currently    Types: Marijuana    Comment: freshman year of high school    Home Medications Prior to Admission medications   Medication Sig Start Date End Date Taking? Authorizing Provider  citalopram (CELEXA) 20 MG tablet Take one and 1/2 tablets daily x 7 days, then take two tablets daily. 06/13/20   Mozingo,  Berdie Ogren, NP  drospirenone-ethinyl estradiol (YAZ,GIANVI,LORYNA) 3-0.02 MG tablet Take 1 tablet by mouth daily.    [provider]  LORazepam (ATIVAN) 0.5 MG tablet Take one tablet twice daily as needed for anxiety/panic. 06/13/20   Mozingo, Berdie Ogren, NP  METRONIDAZOLE IN NACL IV Inject into the vein.    [provider]    Allergies    Penicillins  Review of Systems   Review of Systems  Constitutional:  Negative for fever.  HENT:  Negative for sore throat.   Eyes:  Negative for visual disturbance.  Respiratory:  Negative for shortness of breath.   Cardiovascular:  Negative for chest pain.  Gastrointestinal:  Negative for abdominal pain and vomiting.  Genitourinary:  Negative for dysuria and hematuria.  Musculoskeletal:        R knee pain  Skin:  Positive for rash.  Neurological:  Negative for headaches.  All other systems reviewed and are negative.  Physical Exam Updated Vital Signs BP 108/60 (BP Location: Right Arm)   Pulse 61   Temp 97.6 F (36.4 C) (Oral)   Resp 15   Ht 5\' 2"  (1.575 m)   Wt 55.8 kg   LMP 09/30/2021 (Exact Date)   SpO2 100%   BMI 22.50 kg/m   Physical Exam Vitals and nursing note reviewed.  Constitutional:      General:  She is not in acute distress.    Appearance: She is well-developed.  HENT:     Head: Normocephalic and atraumatic.  Eyes:     Conjunctiva/sclera: Conjunctivae normal.  Cardiovascular:     Rate and Rhythm: Normal rate.  Pulmonary:     Effort: Pulmonary effort is normal.  Abdominal:     General: Abdomen is flat.  Musculoskeletal:        General: No swelling.     Cervical back: Neck supple.     Comments: Swelling and TTP noted to the anterior knee. There is no erythema. There is minimal warmth. ROM is grossly intact. No calf TTP. Wounds are healing well.   Skin:    General: Skin is warm and dry.     Capillary Refill: Capillary refill takes less than 2 seconds.  Neurological:     Mental Status:  She is alert.  Psychiatric:        Mood and Affect: Mood normal.    ED Results / Procedures / Treatments   Labs (all labs ordered are listed, but only abnormal results are displayed) Labs Reviewed  CBC WITH DIFFERENTIAL/PLATELET  BASIC METABOLIC PANEL  HCG, SERUM, QUALITATIVE    EKG None  Radiology US Venous Img Lower Right (DVT Study)  Result Date: 10/14/2021 CLINICAL DATA:  Right knee pain and swelling since arthroscopic performed 09/2016 followed by air travel. Evaluate for DVT. EXAM: RIGHT LOWER EXTREMITY VENOUS DOPPLER ULTRASOUND TECHNIQUE: Gray-scale sonography with graded compression, as well as color Doppler and duplex ultrasound were performed to evaluate the lower extremity deep venous systems from the level of the common femoral vein and including the common femoral, femoral, profunda femoral, popliteal and calf veins including the posterior tibial, peroneal and gastrocnemius veins when visible. The superficial great saphenous vein was also interrogated. Spectral Doppler was utilized to evaluate flow at rest and with distal augmentation maneuvers in the common femoral, femoral and popliteal veins. COMPARISON:  Right knee radiographs-earlier same day FINDINGS: Contralateral Common Femoral Vein: Respiratory phasicity is normal and symmetric with the symptomatic side. No evidence of thrombus. Normal compressibility. Common Femoral Vein: No evidence of thrombus. Normal compressibility, respiratory phasicity and response to augmentation. Saphenofemoral Junction: No evidence of thrombus. Normal compressibility and flow on color Doppler imaging. Profunda Femoral Vein: No evidence of thrombus. Normal compressibility and flow on color Doppler imaging. Femoral Vein: No evidence of thrombus. Normal compressibility, respiratory phasicity and response to augmentation. Popliteal Vein: No evidence of thrombus. Normal compressibility, respiratory phasicity and response to augmentation. Calf Veins: No  evidence of thrombus. Normal compressibility and flow on color Doppler imaging. Superficial Great Saphenous Vein: No evidence of thrombus. Normal compressibility. Venous Reflux:  None. Other Findings:  None. IMPRESSION: No evidence of DVT within the right lower extremity. Electronically Signed   By: Simonne Come M.D.   On: 10/14/2021 13:54   DG Knee Complete 4 Views Right  Result Date: 10/14/2021 CLINICAL DATA:  Right knee pain.  Recent knee arthroscopy. EXAM: RIGHT KNEE - COMPLETE 4+ VIEW COMPARISON:  None. FINDINGS: Suspected trace knee joint effusion. No fracture or dislocation. No evidence of lipohemarthrosis. Joint spaces are preserved. No evidence of chondrocalcinosis. Regional soft tissues. No radiopaque foreign body. IMPRESSION: Suspected trace knee joint effusion. Otherwise, no explanation for patient's right knee pain. Electronically Signed   By: Simonne Come M.D.   On: 10/14/2021 12:53    Procedures Procedures   Medications Ordered in ED Medications  ketorolac (TORADOL) 15 MG/ML injection 15 mg (15 mg  Intramuscular Not Given 10/14/21 1356)    ED Course  I have reviewed the triage vital signs and the nursing notes.  Pertinent labs & imaging results that were available during my care of the patient were reviewed by me and considered in my medical decision making (see chart for details).    MDM Rules/Calculators/A&P                          25 y/o female presents to the ED today for eval of right knee pain after surgery on 11/17.   Reviewed/interpreted labs CBC unremarkable  BMP unremarkable Beta qual neg  Reviewed/interpreted imaging Xray right knee - Suspected trace knee joint effusion. Otherwise, no explanation for patient's right knee pain RLE [place] - No evidence of DVT within the right lower extremity.   4:02 PM CONSULT with Dr. Kathaleen Bury who is in agreement with the plan for d/c with outpatient f/u with the patients orthopedic physician in [place].   Reassessed pt. discussed  findings and plan.  There is low suspicion for septic arthritis based on patient's presentation and reassuring work-up.  No evidence of DVT on lower extremity ultrasound.  Feel she is appropriate for discharge home with close follow-up with her orthopedist.  We did discuss strict return precautions and see her and her mother at bedside voiced understanding and are in agreement with plan.  All questions answered.  Patient stable for discharge.  Final Clinical Impression(s) / ED Diagnoses Final diagnoses:  Acute pain of right knee    Rx / DC Orders ED Discharge Orders     None        Bishop Dublin 10/14/21 1647    Charlesetta Shanks, MD 10/20/21 1945    Charlesetta Shanks, MD 10/20/21 1949

## 2022-03-27 ENCOUNTER — Other Ambulatory Visit (HOSPITAL_COMMUNITY)
Admission: RE | Admit: 2022-03-27 | Discharge: 2022-03-27 | Disposition: A | Discharge status: Home / Self Care | Payer: BC Managed Care – PPO | Source: Ambulatory Visit | Attending: Nurse Practitioner | Admitting: Nurse Practitioner

## 2022-03-27 ENCOUNTER — Other Ambulatory Visit: Payer: Self-pay | Admitting: Nurse Practitioner

## 2022-03-27 DIAGNOSIS — R87810 Cervical high risk human papillomavirus (HPV) DNA test positive: Secondary | ICD-10-CM | POA: Insufficient documentation

## 2022-04-05 LAB — CYTOLOGY - PAP
Comment: NEGATIVE
Diagnosis: UNDETERMINED — AB
High risk HPV: NEGATIVE
